# Patient Record
Sex: Male | Born: 1950
Health system: Southern US, Community
[De-identification: ages and names within clinical notes are randomized; demographics above are authoritative.]

## PROBLEM LIST (undated history)

## (undated) DIAGNOSIS — K589 Irritable bowel syndrome without diarrhea: Secondary | ICD-10-CM

## (undated) DIAGNOSIS — E7800 Pure hypercholesterolemia, unspecified: Secondary | ICD-10-CM

## (undated) DIAGNOSIS — R569 Unspecified convulsions: Secondary | ICD-10-CM

## (undated) DIAGNOSIS — R202 Paresthesia of skin: Secondary | ICD-10-CM

## (undated) DIAGNOSIS — F32A Depression, unspecified: Secondary | ICD-10-CM

## (undated) DIAGNOSIS — F329 Major depressive disorder, single episode, unspecified: Secondary | ICD-10-CM

## (undated) DIAGNOSIS — B20 Human immunodeficiency virus [HIV] disease: Secondary | ICD-10-CM

## (undated) DIAGNOSIS — Z21 Asymptomatic human immunodeficiency virus [HIV] infection status: Secondary | ICD-10-CM

## (undated) HISTORY — PX: ROTATOR CUFF REPAIR: SHX139

## (undated) HISTORY — PX: KNEE SURGERY: SHX244

## (undated) HISTORY — DX: Paresthesia of skin: R20.2

## (undated) HISTORY — PX: JOINT REPLACEMENT: SHX530

---

## 2009-04-16 ENCOUNTER — Ambulatory Visit: Payer: Self-pay | Admitting: Diagnostic Radiology

## 2009-04-16 ENCOUNTER — Emergency Department (HOSPITAL_BASED_OUTPATIENT_CLINIC_OR_DEPARTMENT_OTHER): Admission: EM | Admit: 2009-04-16 | Discharge: 2009-04-16 | Payer: Self-pay | Admitting: Emergency Medicine

## 2009-11-05 ENCOUNTER — Ambulatory Visit: Payer: Self-pay | Admitting: Diagnostic Radiology

## 2009-11-05 ENCOUNTER — Emergency Department (HOSPITAL_BASED_OUTPATIENT_CLINIC_OR_DEPARTMENT_OTHER): Admission: EM | Admit: 2009-11-05 | Discharge: 2009-11-05 | Payer: Self-pay | Admitting: Emergency Medicine

## 2010-08-10 ENCOUNTER — Emergency Department (HOSPITAL_BASED_OUTPATIENT_CLINIC_OR_DEPARTMENT_OTHER)
Admission: EM | Admit: 2010-08-10 | Discharge: 2010-08-10 | Payer: Self-pay | Source: Home / Self Care | Admitting: Emergency Medicine

## 2010-08-26 ENCOUNTER — Emergency Department (HOSPITAL_BASED_OUTPATIENT_CLINIC_OR_DEPARTMENT_OTHER)
Admission: EM | Admit: 2010-08-26 | Discharge: 2010-08-26 | Payer: Self-pay | Source: Home / Self Care | Admitting: Emergency Medicine

## 2010-11-03 LAB — BASIC METABOLIC PANEL
Calcium: 9.8 mg/dL (ref 8.4–10.5)
GFR calc Af Amer: 54 mL/min — ABNORMAL LOW (ref >60)
GFR calc non Af Amer: 45 mL/min — ABNORMAL LOW (ref >60)
Glucose, Bld: 104 mg/dL — ABNORMAL HIGH (ref 70–99)
Sodium: 141 mEq/L (ref 135–145)

## 2010-11-29 LAB — BASIC METABOLIC PANEL
BUN: 10 mg/dL (ref 6–23)
Glucose, Bld: 103 mg/dL — ABNORMAL HIGH (ref 70–99)
Potassium: 4.4 mEq/L (ref 3.5–5.1)
Sodium: 137 mEq/L (ref 135–145)

## 2010-11-29 LAB — CBC
HCT: 45.5 % (ref 39.0–52.0)
MCHC: 34 g/dL (ref 30.0–36.0)
RBC: 4.09 MIL/uL — ABNORMAL LOW (ref 4.22–5.81)
RDW: 12.9 % (ref 11.5–15.5)
WBC: 13.8 10*3/uL — ABNORMAL HIGH (ref 4.0–10.5)

## 2010-11-29 LAB — DIFFERENTIAL
Basophils Absolute: 0.3 10*3/uL — ABNORMAL HIGH (ref 0.0–0.1)
Basophils Relative: 2 % — ABNORMAL HIGH (ref 0–1)
Lymphocytes Relative: 13 % (ref 12–46)
Monocytes Absolute: 0.8 10*3/uL (ref 0.1–1.0)

## 2011-06-25 ENCOUNTER — Emergency Department (HOSPITAL_BASED_OUTPATIENT_CLINIC_OR_DEPARTMENT_OTHER)
Admission: EM | Admit: 2011-06-25 | Discharge: 2011-06-25 | Disposition: A | Discharge status: Home / Self Care | Payer: Medicare Other | Attending: Emergency Medicine | Admitting: Emergency Medicine

## 2011-06-25 ENCOUNTER — Encounter: Payer: Self-pay | Admitting: *Deleted

## 2011-06-25 DIAGNOSIS — Z21 Asymptomatic human immunodeficiency virus [HIV] infection status: Secondary | ICD-10-CM | POA: Insufficient documentation

## 2011-06-25 DIAGNOSIS — K589 Irritable bowel syndrome without diarrhea: Secondary | ICD-10-CM | POA: Insufficient documentation

## 2011-06-25 DIAGNOSIS — M25519 Pain in unspecified shoulder: Secondary | ICD-10-CM | POA: Insufficient documentation

## 2011-06-25 DIAGNOSIS — M25511 Pain in right shoulder: Secondary | ICD-10-CM

## 2011-06-25 DIAGNOSIS — M79609 Pain in unspecified limb: Secondary | ICD-10-CM | POA: Insufficient documentation

## 2011-06-25 HISTORY — DX: Irritable bowel syndrome, unspecified: K58.9

## 2011-06-25 HISTORY — DX: Unspecified convulsions: R56.9

## 2011-06-25 HISTORY — DX: Major depressive disorder, single episode, unspecified: F32.9

## 2011-06-25 HISTORY — DX: Depression, unspecified: F32.A

## 2011-06-25 HISTORY — DX: Human immunodeficiency virus (HIV) disease: B20

## 2011-06-25 HISTORY — DX: Asymptomatic human immunodeficiency virus (hiv) infection status: Z21

## 2011-06-25 MED ORDER — HYDROCODONE-ACETAMINOPHEN 5-500 MG PO TABS: 2.0000 | ORAL_TABLET | ORAL | Status: AC | PRN

## 2011-06-25 MED ORDER — ONDANSETRON HCL 4 MG PO TABS: 4.0000 mg | ORAL_TABLET | Freq: Four times a day (QID) | ORAL | Status: AC

## 2011-06-25 MED ORDER — HYDROMORPHONE HCL 1 MG/ML IJ SOLN
1.0000 mg | Freq: Once | INTRAMUSCULAR | Status: AC
  Administered 2011-06-25: 1 mg via INTRAMUSCULAR
  Filled 2011-06-25: qty 1

## 2011-06-25 NOTE — ED Notes (Signed)
MD at bedside. 

## 2011-06-25 NOTE — ED Notes (Signed)
C/o shoulder pain s/p rotator cuff surgery. PO pain meds not helping.

## 2011-06-25 NOTE — ED Notes (Signed)
Pt had rotator cuff repair surgery on 10/31, c/o extreme pain and no relief from pain meds.

## 2011-06-25 NOTE — ED Provider Notes (Signed)
History     CSN: [id number] Arrival date & time: 06/25/2011  5:37 AM   First MD Initiated Contact with Patient 06/25/11 [phone]      Chief Complaint  Patient presents with  . Arm Pain    (Consider location/radiation/quality/duration/timing/severity/associated sxs/prior treatment) Patient is a 60 y.o. male presenting with arm pain.  Arm Pain This is a new problem. The current episode started 6 to 12 hours ago. The problem occurs constantly. The problem has been gradually worsening. Pertinent negatives include no chest pain, no abdominal pain, no headaches and no shortness of breath. Exacerbated by: Any kind of movement or palpation. The symptoms are relieved by nothing. He has tried rest and a cold compress for the symptoms. The treatment provided no relief.   Patient had orthopedic surgeon his right shoulder yesterday afternoon. He was discharged home and states last night that the anesthetic wore off, and he took his prescribed oxycodone and muscle relaxers, and despite all this he is still having severe pain. He is unable to sleep. He called his physician who recommended doubling up on his oxycodone, but only take half a tab and presented here. States he knows how to manage pain because he has had 2 hip surgeries and feels like the pain is too severe to handle at home. No vomiting. No fevers. No numbness or tingling in his arm or hand. Symptoms are severe. Pain is nonradiating. Pain described as sharp in nature. No history of same  Past Medical History  Diagnosis Date  . IBS (irritable bowel syndrome)   . Depression   . HIV (human immunodeficiency virus infection)   . Seizures     Past Surgical History  Procedure Date  . Joint replacement   . Rotator cuff repair     History reviewed. No pertinent family history.  History  Substance Use Topics  . Smoking status: Never Smoker   . Smokeless tobacco: Not on file  . Alcohol Use: Yes      Review of Systems  Constitutional:  Negative for fever and chills.  HENT: Negative for neck pain and neck stiffness.   Eyes: Negative for pain.  Respiratory: Negative for shortness of breath.   Cardiovascular: Negative for chest pain.  Gastrointestinal: Negative for abdominal pain.  Genitourinary: Negative for dysuria.  Musculoskeletal: Negative for back pain and gait problem.  Skin: Positive for wound. Negative for rash.  Neurological: Negative for headaches.  All other systems reviewed and are negative.    Allergies  Review of patient's allergies indicates no known allergies.  Home Medications   Current Outpatient Rx  Name Route Sig Dispense Refill  . BUPROPION HCL ER (SR) 150 MG PO TB12 Oral Take 150 mg by mouth 2 (two) times daily.      Marland Kitchen DIPHENOXYLATE-ATROPINE 2.5-0.025 MG PO TABS Oral Take 1 tablet by mouth 4 (four) times daily as needed.      Marland Kitchen ESCITALOPRAM OXALATE 10 MG PO TABS Oral Take 10 mg by mouth daily.      Marland Kitchen LAMIVUDINE 300 MG PO TABS Oral Take 300 mg by mouth daily.      Marland Kitchen LOPINAVIR-RITONAVIR 200-50 MG PO TABS Oral Take 2 tablets by mouth 2 (two) times daily.      Marland Kitchen METHOCARBAMOL 500 MG PO TABS Oral Take 500 mg by mouth 4 (four) times daily.      Marland Kitchen PHENYTOIN SODIUM EXTENDED 100 MG PO CAPS Oral Take by mouth 3 (three) times daily.      Marland Kitchen ROSUVASTATIN  CALCIUM 10 MG PO TABS Oral Take 10 mg by mouth daily.        BP 134/80  Pulse 69  Temp(Src) 98.3 F (36.8 C) (Oral)  Resp 18  SpO2 97%  Physical Exam  Constitutional: He is oriented to person, place, and time. He appears well-developed and well-nourished.  HENT:  Head: Normocephalic and atraumatic.  Eyes: Conjunctivae and EOM are normal. Pupils are equal, round, and reactive to light.  Neck: Full passive range of motion without pain. Neck supple. No thyromegaly present.       No midline cervical tenderness  Cardiovascular: Normal rate, regular rhythm, S1 normal, S2 normal and intact distal pulses.   Pulmonary/Chest: Effort normal and breath  sounds normal.  Abdominal: Soft. Bowel sounds are normal. There is no tenderness. There is no CVA tenderness.  Musculoskeletal: Normal range of motion.       Right upper extremity: Multiple padded bandages in place over right shoulder with a sling in place. Tender to palpation over the shoulder, but not over the trapezius or the bicep. Distal neurovascular is intact. Do to surgery less than 24 hours ago I did not remove the bandages.  Neurological: He is alert and oriented to person, place, and time. He has normal strength and normal reflexes. No cranial nerve deficit or sensory deficit. He displays a negative Romberg sign. GCS eye subscore is 4. GCS verbal subscore is 5. GCS motor subscore is 6.  Skin: Skin is warm and dry. No rash noted. No cyanosis. Nails show no clubbing.  Psychiatric: He has a normal mood and affect. His speech is normal and behavior is normal.    ED Course  Procedures (including critical care time)  Plan Dilaudid for pain control.   MDM  Patient in severe pain status post right shoulder surgery for rotator cuff repair, less than 24 hours ago. Dilaudid given as above, ice applied, and I strongly encouraged patient to take medications as prescribed. Hydrocodone prescription provided in view of oxycodone as that is making patient very nauseated. I strongly encouraged to keep ice on his shoulder as directed by his physician.            Sunnie Nielsen, MD 06/25/11 [phone]

## 2012-05-20 ENCOUNTER — Encounter (HOSPITAL_BASED_OUTPATIENT_CLINIC_OR_DEPARTMENT_OTHER): Payer: Self-pay | Admitting: *Deleted

## 2012-05-20 ENCOUNTER — Emergency Department (HOSPITAL_BASED_OUTPATIENT_CLINIC_OR_DEPARTMENT_OTHER)
Admission: EM | Admit: 2012-05-20 | Discharge: 2012-05-20 | Disposition: A | Discharge status: Home / Self Care | Payer: Medicare Other | Attending: Emergency Medicine | Admitting: Emergency Medicine

## 2012-05-20 DIAGNOSIS — Z21 Asymptomatic human immunodeficiency virus [HIV] infection status: Secondary | ICD-10-CM | POA: Insufficient documentation

## 2012-05-20 DIAGNOSIS — N419 Inflammatory disease of prostate, unspecified: Secondary | ICD-10-CM

## 2012-05-20 DIAGNOSIS — K589 Irritable bowel syndrome without diarrhea: Secondary | ICD-10-CM | POA: Insufficient documentation

## 2012-05-20 DIAGNOSIS — R339 Retention of urine, unspecified: Secondary | ICD-10-CM | POA: Insufficient documentation

## 2012-05-20 LAB — URINALYSIS, ROUTINE W REFLEX MICROSCOPIC
Bilirubin Urine: NEGATIVE
Leukocytes, UA: NEGATIVE
Specific Gravity, Urine: 1.012 (ref 1.005–1.030)

## 2012-05-20 LAB — URINE MICROSCOPIC-ADD ON

## 2012-05-20 MED ORDER — CIPROFLOXACIN HCL 500 MG PO TABS: 500.0000 mg | ORAL_TABLET | Freq: Two times a day (BID) | ORAL | Status: DC

## 2012-05-20 MED ORDER — CIPROFLOXACIN HCL 500 MG PO TABS
500.0000 mg | ORAL_TABLET | Freq: Once | ORAL | Status: AC
  Administered 2012-05-20: 500 mg via ORAL
  Filled 2012-05-20: qty 1

## 2012-05-20 NOTE — ED Notes (Signed)
Unable to void>frequency>does not empty bladder

## 2012-05-20 NOTE — ED Provider Notes (Signed)
History     CSN: [id number]  Arrival date & time 05/20/12  1605   First MD Initiated Contact with Patient 05/20/12 1624      Chief Complaint  Patient presents with  . Urinary Retention    (Consider location/radiation/quality/duration/timing/severity/associated sxs/prior treatment) HPI Pt reports 2 days of urinary frequency, feeling of incomplete voiding and bladder pressure. Denies any pain with urination. No fever, no pain with bowel movements. He states symptoms come and go, no particular provoking or relieving factors. History of HIV but compliant with meds and latest CD4 is adequate and HIV viral load is undetectable per patient. Older brother has history of prostate cancer.   Past Medical History  Diagnosis Date  . IBS (irritable bowel syndrome)   . Depression   . HIV (human immunodeficiency virus infection)   . Seizures     Past Surgical History  Procedure Date  . Joint replacement   . Rotator cuff repair     History reviewed. No pertinent family history.  History  Substance Use Topics  . Smoking status: Never Smoker   . Smokeless tobacco: Not on file  . Alcohol Use: Yes      Review of Systems All other systems reviewed and are negative except as noted in HPI.   Allergies  Review of patient's allergies indicates no known allergies.  Home Medications   Current Outpatient Rx  Name Route Sig Dispense Refill  . BUPROPION HCL ER (SR) 150 MG PO TB12 Oral Take 150 mg by mouth 2 (two) times daily.      Marland Kitchen DIPHENOXYLATE-ATROPINE 2.5-0.025 MG PO TABS Oral Take 1 tablet by mouth 4 (four) times daily as needed.      Marland Kitchen ESCITALOPRAM OXALATE 10 MG PO TABS Oral Take 10 mg by mouth daily.      Marland Kitchen LAMIVUDINE 300 MG PO TABS Oral Take 300 mg by mouth daily.      Marland Kitchen LOPINAVIR-RITONAVIR 200-50 MG PO TABS Oral Take 2 tablets by mouth 2 (two) times daily.      Marland Kitchen METHOCARBAMOL 500 MG PO TABS Oral Take 500 mg by mouth 4 (four) times daily.      Marland Kitchen PHENYTOIN SODIUM EXTENDED 100 MG  PO CAPS Oral Take by mouth 3 (three) times daily.      Marland Kitchen ROSUVASTATIN CALCIUM 10 MG PO TABS Oral Take 10 mg by mouth daily.        BP 126/75  Pulse 98  Temp 98.5 F (36.9 C) (Oral)  Resp 20  Ht 5\' 8"  (1.727 m)  Wt 160 lb (72.576 kg)  BMI 24.33 kg/m2  SpO2 95%  Physical Exam  Nursing note and vitals reviewed. Constitutional: He is oriented to person, place, and time. He appears well-developed and well-nourished.  HENT:  Head: Normocephalic and atraumatic.  Eyes: EOM are normal. Pupils are equal, round, and reactive to light.  Neck: Normal range of motion. Neck supple.  Cardiovascular: Normal rate, normal heart sounds and intact distal pulses.   Pulmonary/Chest: Effort normal and breath sounds normal.  Abdominal: Bowel sounds are normal. He exhibits no distension. There is no tenderness.  Genitourinary:       Prostate normal size, mildly tender, not boggy, no rectal masses, stool color is normal  Musculoskeletal: Normal range of motion. He exhibits no edema and no tenderness.  Neurological: He is alert and oriented to person, place, and time. He has normal strength. No cranial nerve deficit or sensory deficit.  Skin: Skin is warm and dry. No rash  noted.  Psychiatric: He has a normal mood and affect.    ED Course  Procedures (including critical care time)  Labs Reviewed  URINALYSIS, ROUTINE W REFLEX MICROSCOPIC - Abnormal; Notable for the following:    Hgb urine dipstick MODERATE (*)     Protein, ur 100 (*)     All other components within normal limits  URINE MICROSCOPIC-ADD ON  URINE CULTURE   No results found.   No diagnosis found.    MDM  UA unremarkable. He is on some medications which may cause urinary retention, but he is able to void so do not feel foley is indicated. Will treat for possible prostatitis as well, and advised PCP follow up in [place] next week for a recheck.         Bilaal Leib B. Bernette Mayers, MD 05/20/12 1714

## 2012-05-20 NOTE — ED Notes (Signed)
MD at bedside. 

## 2012-05-21 LAB — URINE CULTURE

## 2012-06-20 ENCOUNTER — Other Ambulatory Visit (HOSPITAL_COMMUNITY): Payer: Self-pay | Admitting: *Deleted

## 2012-06-20 ENCOUNTER — Other Ambulatory Visit: Payer: Self-pay | Admitting: *Deleted

## 2012-06-20 DIAGNOSIS — N62 Hypertrophy of breast: Secondary | ICD-10-CM

## 2012-06-28 ENCOUNTER — Ambulatory Visit
Admission: RE | Admit: 2012-06-28 | Discharge: 2012-06-28 | Disposition: A | Discharge status: Home / Self Care | Payer: Medicare Other | Source: Ambulatory Visit | Attending: Internal Medicine | Admitting: Internal Medicine

## 2012-06-28 DIAGNOSIS — N62 Hypertrophy of breast: Secondary | ICD-10-CM

## 2012-11-27 ENCOUNTER — Emergency Department (HOSPITAL_BASED_OUTPATIENT_CLINIC_OR_DEPARTMENT_OTHER): Payer: Medicare Other

## 2012-11-27 ENCOUNTER — Emergency Department (HOSPITAL_BASED_OUTPATIENT_CLINIC_OR_DEPARTMENT_OTHER)
Admission: EM | Admit: 2012-11-27 | Discharge: 2012-11-27 | Disposition: A | Discharge status: Home / Self Care | Payer: Medicare Other | Attending: Emergency Medicine | Admitting: Emergency Medicine

## 2012-11-27 ENCOUNTER — Encounter (HOSPITAL_BASED_OUTPATIENT_CLINIC_OR_DEPARTMENT_OTHER): Payer: Self-pay | Admitting: *Deleted

## 2012-11-27 DIAGNOSIS — K589 Irritable bowel syndrome without diarrhea: Secondary | ICD-10-CM | POA: Insufficient documentation

## 2012-11-27 DIAGNOSIS — J069 Acute upper respiratory infection, unspecified: Secondary | ICD-10-CM

## 2012-11-27 DIAGNOSIS — J329 Chronic sinusitis, unspecified: Secondary | ICD-10-CM

## 2012-11-27 DIAGNOSIS — F329 Major depressive disorder, single episode, unspecified: Secondary | ICD-10-CM | POA: Insufficient documentation

## 2012-11-27 DIAGNOSIS — D696 Thrombocytopenia, unspecified: Secondary | ICD-10-CM

## 2012-11-27 DIAGNOSIS — R059 Cough, unspecified: Secondary | ICD-10-CM | POA: Insufficient documentation

## 2012-11-27 DIAGNOSIS — R05 Cough: Secondary | ICD-10-CM | POA: Insufficient documentation

## 2012-11-27 DIAGNOSIS — Z21 Asymptomatic human immunodeficiency virus [HIV] infection status: Secondary | ICD-10-CM | POA: Insufficient documentation

## 2012-11-27 DIAGNOSIS — F3289 Other specified depressive episodes: Secondary | ICD-10-CM | POA: Insufficient documentation

## 2012-11-27 DIAGNOSIS — Z8669 Personal history of other diseases of the nervous system and sense organs: Secondary | ICD-10-CM | POA: Insufficient documentation

## 2012-11-27 DIAGNOSIS — Z79899 Other long term (current) drug therapy: Secondary | ICD-10-CM | POA: Insufficient documentation

## 2012-11-27 LAB — CBC WITH DIFFERENTIAL/PLATELET
HCT: 48.3 % (ref 39.0–52.0)
Lymphocytes Relative: 25 % (ref 12–46)
MCHC: 34.2 g/dL (ref 30.0–36.0)
Neutro Abs: 3.1 10*3/uL (ref 1.7–7.7)
Neutrophils Relative %: 58 % (ref 43–77)
Platelets: 96 10*3/uL — ABNORMAL LOW (ref 150–400)
RBC: 4.54 MIL/uL (ref 4.22–5.81)
RDW: 12.7 % (ref 11.5–15.5)
WBC: 5.3 10*3/uL (ref 4.0–10.5)

## 2012-11-27 MED ORDER — AZITHROMYCIN 250 MG PO TABS: 250.0000 mg | ORAL_TABLET | Freq: Every day | ORAL | Status: DC

## 2012-11-27 NOTE — ED Provider Notes (Signed)
History     CSN: [id number]  Arrival date & time 11/27/12  1339   First MD Initiated Contact with Patient 11/27/12 1400      Chief Complaint  Patient presents with  . Fever    (Consider location/radiation/quality/duration/timing/severity/associated sxs/prior treatment) HPI Comments: Patient with a history of HIV presents today with a chief complaint of cough, which is productive of clear sputum that has been present since yesterday.  He has also had some nasal congestion and rhinorrhea.  He denies chest pain or SOB.  He reports that he has felt warm, but has not actually taken his temperature.  He has not taken anything for symptoms prior to arrival.  Temperature 99 orally upon arrival.  He states that he is compliant with his HIV medications.  He reports that his last CD4 count was 400 and that his viral load was undetectable.  No history of AIDs.    The history is provided by the patient.    Past Medical History  Diagnosis Date  . IBS (irritable bowel syndrome)   . Depression   . HIV (human immunodeficiency virus infection)   . Seizures     Past Surgical History  Procedure Laterality Date  . Joint replacement    . Rotator cuff repair      History reviewed. No pertinent family history.  History  Substance Use Topics  . Smoking status: Never Smoker   . Smokeless tobacco: Not on file  . Alcohol Use: Yes      Review of Systems  Constitutional: Positive for fever and chills.  HENT: Positive for sinus pressure. Negative for congestion, sore throat, rhinorrhea and trouble swallowing.   Respiratory: Positive for cough. Negative for shortness of breath and wheezing.   Cardiovascular: Negative for chest pain.  Skin: Negative for rash.  All other systems reviewed and are negative.    Allergies  Review of patient's allergies indicates no known allergies.  Home Medications   Current Outpatient Rx  Name  Route  Sig  Dispense  Refill  . loperamide (IMODIUM A-D) 2 MG  tablet   Oral   Take 2 mg by mouth 4 (four) times daily as needed for diarrhea or loose stools.         . pravastatin (PRAVACHOL) 20 MG tablet   Oral   Take 20 mg by mouth daily.         . saquinavir (INVIRASE) 500 MG tablet   Oral   Take 1,000 mg by mouth 2 (two) times daily.         Marland Kitchen buPROPion (WELLBUTRIN SR) 150 MG 12 hr tablet   Oral   Take 150 mg by mouth 2 (two) times daily.           . ciprofloxacin (CIPRO) 500 MG tablet   Oral   Take 1 tablet (500 mg total) by mouth 2 (two) times daily.   28 tablet   0   . diphenoxylate-atropine (LOMOTIL) 2.5-0.025 MG per tablet   Oral   Take 1 tablet by mouth 4 (four) times daily as needed.           Marland Kitchen escitalopram (LEXAPRO) 10 MG tablet   Oral   Take 10 mg by mouth daily.           Marland Kitchen lamivudine (EPIVIR) 300 MG tablet   Oral   Take 300 mg by mouth daily.           Marland Kitchen lopinavir-ritonavir (KALETRA) 200-50 MG per tablet  Oral   Take 2 tablets by mouth 2 (two) times daily.           . methocarbamol (ROBAXIN) 500 MG tablet   Oral   Take 500 mg by mouth 4 (four) times daily.           . phenytoin (DILANTIN) 100 MG ER capsule   Oral   Take by mouth 3 (three) times daily.           . rosuvastatin (CRESTOR) 10 MG tablet   Oral   Take 10 mg by mouth daily.             BP 107/74  Pulse 87  Temp(Src) 99 F (37.2 C) (Oral)  Resp 15  Ht 5\' 7"  (1.702 m)  Wt 160 lb (72.576 kg)  BMI 25.05 kg/m2  SpO2 98%  Physical Exam  Nursing note and vitals reviewed. Constitutional: He appears well-developed and well-nourished. No distress.  HENT:  Head: Normocephalic and atraumatic.  Right Ear: Hearing, tympanic membrane and ear canal normal.  Left Ear: Hearing, tympanic membrane and ear canal normal.  Nose: Nose normal. Right sinus exhibits no maxillary sinus tenderness and no frontal sinus tenderness. Left sinus exhibits no maxillary sinus tenderness and no frontal sinus tenderness.  Mouth/Throat: Uvula is  midline, oropharynx is clear and moist and mucous membranes are normal.  Neck: Normal range of motion. Neck supple.  Cardiovascular: Normal rate, regular rhythm and normal heart sounds.   Pulmonary/Chest: Effort normal and breath sounds normal. No respiratory distress. He has no wheezes. He has no rales.  Neurological: He is alert.  Skin: Skin is warm and dry. He is not diaphoretic.  Psychiatric: He has a normal mood and affect.    ED Course  Procedures (including critical care time)  Labs Reviewed - No data to display No results found.   No diagnosis found.    MDM  Patient with a history of HIV presents with a chief complaint of cough and nasal congestion.  CXR negative.  Patient is afebrile.  No signs of respiratory distress.  Labs unremarkable.  Symptoms most consistent with Viral URI.  Return precautions given to the patient.        Pascal Lux [place], PA-C 11/30/12 1220

## 2012-11-27 NOTE — ED Notes (Signed)
Fever , chills prod cough with clr sputum onset yest.  Denies other s/s.

## 2012-11-27 NOTE — ED Notes (Signed)
Patient transported to X-ray 

## 2012-12-02 NOTE — ED Provider Notes (Signed)
Medical screening examination/treatment/procedure(s) were performed by non-physician practitioner and as supervising physician I was immediately available for consultation/collaboration.   Orlondo Holycross W. Lizbeth Feijoo, MD 12/02/12 1114 

## 2013-04-30 ENCOUNTER — Emergency Department (HOSPITAL_BASED_OUTPATIENT_CLINIC_OR_DEPARTMENT_OTHER): Payer: Medicare Other

## 2013-04-30 ENCOUNTER — Encounter (HOSPITAL_BASED_OUTPATIENT_CLINIC_OR_DEPARTMENT_OTHER): Payer: Self-pay | Admitting: Emergency Medicine

## 2013-04-30 ENCOUNTER — Emergency Department (HOSPITAL_BASED_OUTPATIENT_CLINIC_OR_DEPARTMENT_OTHER)
Admission: EM | Admit: 2013-04-30 | Discharge: 2013-04-30 | Disposition: A | Discharge status: Home / Self Care | Payer: Medicare Other | Attending: Emergency Medicine | Admitting: Emergency Medicine

## 2013-04-30 DIAGNOSIS — Z792 Long term (current) use of antibiotics: Secondary | ICD-10-CM | POA: Insufficient documentation

## 2013-04-30 DIAGNOSIS — F3289 Other specified depressive episodes: Secondary | ICD-10-CM | POA: Insufficient documentation

## 2013-04-30 DIAGNOSIS — X58XXXA Exposure to other specified factors, initial encounter: Secondary | ICD-10-CM | POA: Insufficient documentation

## 2013-04-30 DIAGNOSIS — Z8719 Personal history of other diseases of the digestive system: Secondary | ICD-10-CM | POA: Insufficient documentation

## 2013-04-30 DIAGNOSIS — S92301A Fracture of unspecified metatarsal bone(s), right foot, initial encounter for closed fracture: Secondary | ICD-10-CM

## 2013-04-30 DIAGNOSIS — S92309A Fracture of unspecified metatarsal bone(s), unspecified foot, initial encounter for closed fracture: Secondary | ICD-10-CM | POA: Insufficient documentation

## 2013-04-30 DIAGNOSIS — G40909 Epilepsy, unspecified, not intractable, without status epilepticus: Secondary | ICD-10-CM | POA: Insufficient documentation

## 2013-04-30 DIAGNOSIS — F329 Major depressive disorder, single episode, unspecified: Secondary | ICD-10-CM | POA: Insufficient documentation

## 2013-04-30 DIAGNOSIS — Z79899 Other long term (current) drug therapy: Secondary | ICD-10-CM | POA: Insufficient documentation

## 2013-04-30 DIAGNOSIS — Z21 Asymptomatic human immunodeficiency virus [HIV] infection status: Secondary | ICD-10-CM | POA: Insufficient documentation

## 2013-04-30 DIAGNOSIS — Y929 Unspecified place or not applicable: Secondary | ICD-10-CM | POA: Insufficient documentation

## 2013-04-30 DIAGNOSIS — Y939 Activity, unspecified: Secondary | ICD-10-CM | POA: Insufficient documentation

## 2013-04-30 MED ORDER — IBUPROFEN 600 MG PO TABS: 600.0000 mg | ORAL_TABLET | Freq: Four times a day (QID) | ORAL | Status: DC | PRN

## 2013-04-30 MED ORDER — HYDROCODONE-ACETAMINOPHEN 5-325 MG PO TABS: 1.0000 | ORAL_TABLET | Freq: Four times a day (QID) | ORAL | Status: DC | PRN
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
## 2013-04-30 NOTE — ED Notes (Signed)
Foot started hurting 2 days ago, hurting top of right foot with swelling, denies injuries

## 2013-05-01 NOTE — ED Provider Notes (Signed)
Medical screening examination/treatment/procedure(s) were performed by non-physician practitioner and as supervising physician I was immediately available for consultation/collaboration.  Doug Sou, MD 05/01/13 0010

## 2013-05-23 ENCOUNTER — Emergency Department (HOSPITAL_BASED_OUTPATIENT_CLINIC_OR_DEPARTMENT_OTHER)
Admission: EM | Admit: 2013-05-23 | Discharge: 2013-05-23 | Disposition: A | Discharge status: Other Acute Inpatient Hospital | Payer: Medicare Other | Attending: Emergency Medicine | Admitting: Emergency Medicine

## 2013-05-23 ENCOUNTER — Encounter (HOSPITAL_BASED_OUTPATIENT_CLINIC_OR_DEPARTMENT_OTHER): Payer: Self-pay | Admitting: *Deleted

## 2013-05-23 DIAGNOSIS — E860 Dehydration: Secondary | ICD-10-CM | POA: Insufficient documentation

## 2013-05-23 DIAGNOSIS — F329 Major depressive disorder, single episode, unspecified: Secondary | ICD-10-CM | POA: Insufficient documentation

## 2013-05-23 DIAGNOSIS — Z79899 Other long term (current) drug therapy: Secondary | ICD-10-CM | POA: Insufficient documentation

## 2013-05-23 DIAGNOSIS — R197 Diarrhea, unspecified: Secondary | ICD-10-CM

## 2013-05-23 DIAGNOSIS — F3289 Other specified depressive episodes: Secondary | ICD-10-CM | POA: Insufficient documentation

## 2013-05-23 DIAGNOSIS — Z21 Asymptomatic human immunodeficiency virus [HIV] infection status: Secondary | ICD-10-CM | POA: Insufficient documentation

## 2013-05-23 DIAGNOSIS — N179 Acute kidney failure, unspecified: Secondary | ICD-10-CM

## 2013-05-23 DIAGNOSIS — R1110 Vomiting, unspecified: Secondary | ICD-10-CM | POA: Insufficient documentation

## 2013-05-23 DIAGNOSIS — Z792 Long term (current) use of antibiotics: Secondary | ICD-10-CM | POA: Insufficient documentation

## 2013-05-23 DIAGNOSIS — G40909 Epilepsy, unspecified, not intractable, without status epilepticus: Secondary | ICD-10-CM | POA: Insufficient documentation

## 2013-05-23 DIAGNOSIS — Z8719 Personal history of other diseases of the digestive system: Secondary | ICD-10-CM | POA: Insufficient documentation

## 2013-05-23 LAB — CBC WITH DIFFERENTIAL/PLATELET
Basophils Absolute: 0 10*3/uL (ref 0.0–0.1)
Eosinophils Relative: 0 % (ref 0–5)
HCT: 47 % (ref 39.0–52.0)
Hemoglobin: 16.7 g/dL (ref 13.0–17.0)
Lymphs Abs: 0.6 10*3/uL — ABNORMAL LOW (ref 0.7–4.0)
MCH: 32.7 pg (ref 26.0–34.0)
MCHC: 35.5 g/dL (ref 30.0–36.0)
MCV: 92.2 fL (ref 78.0–100.0)
Monocytes Absolute: 0.7 10*3/uL (ref 0.1–1.0)
Neutro Abs: 8.9 10*3/uL — ABNORMAL HIGH (ref 1.7–7.7)
Neutrophils Relative %: 84 % — ABNORMAL HIGH (ref 43–77)
Platelets: 271 10*3/uL (ref 150–400)
WBC: 10.2 10*3/uL (ref 4.0–10.5)

## 2013-05-23 LAB — URINALYSIS, ROUTINE W REFLEX MICROSCOPIC
Bilirubin Urine: NEGATIVE
Glucose, UA: NEGATIVE mg/dL
Ketones, ur: NEGATIVE mg/dL
pH: 5.5 (ref 5.0–8.0)

## 2013-05-23 LAB — COMPREHENSIVE METABOLIC PANEL
AST: 48 U/L — ABNORMAL HIGH (ref 0–37)
CO2: 15 mEq/L — ABNORMAL LOW (ref 19–32)
Calcium: 9.5 mg/dL (ref 8.4–10.5)
Creatinine, Ser: 7.8 mg/dL — ABNORMAL HIGH (ref 0.50–1.35)
GFR calc Af Amer: 8 mL/min — ABNORMAL LOW (ref >90)
GFR calc non Af Amer: 7 mL/min — ABNORMAL LOW (ref >90)
Glucose, Bld: 119 mg/dL — ABNORMAL HIGH (ref 70–99)
Sodium: 128 mEq/L — ABNORMAL LOW (ref 135–145)
Total Protein: 7.6 g/dL (ref 6.0–8.3)

## 2013-05-23 LAB — URINE MICROSCOPIC-ADD ON

## 2013-05-23 MED ORDER — SODIUM CHLORIDE 0.9 % IV SOLN
Freq: Once | INTRAVENOUS | Status: AC
  Administered 2013-05-23: 21:00:00 via INTRAVENOUS

## 2013-05-23 MED ORDER — MORPHINE SULFATE 2 MG/ML IJ SOLN
1.0000 mg | Freq: Once | INTRAMUSCULAR | Status: AC
  Administered 2013-05-23: 1 mg via INTRAVENOUS
  Filled 2013-05-23: qty 1

## 2013-05-23 MED ORDER — ONDANSETRON 8 MG PO TBDP
8.0000 mg | ORAL_TABLET | Freq: Once | ORAL | Status: AC
  Administered 2013-05-23: 8 mg via ORAL

## 2013-05-23 MED ORDER — ONDANSETRON 8 MG PO TBDP
ORAL_TABLET | ORAL | Status: AC
  Filled 2013-05-23: qty 1

## 2013-05-23 MED ORDER — SODIUM CHLORIDE 0.9 % IV SOLN
Freq: Once | INTRAVENOUS | Status: AC
  Administered 2013-05-23: 1000 mL via INTRAVENOUS

## 2013-05-23 NOTE — ED Provider Notes (Signed)
CSN: [id number]     Arrival date & time 05/23/13  1756 History   First MD Initiated Contact with Patient 05/23/13 1820     Chief Complaint  Patient presents with  . Diarrhea   (Consider location/radiation/quality/duration/timing/severity/associated sxs/prior Treatment) Patient is a 62 y.o. male presenting with vomiting. The history is provided by the patient. No language interpreter was used.  Emesis Severity:  Moderate Duration:  4 days Timing:  Constant Number of daily episodes:  Multiple Able to tolerate:  Liquids Progression:  Worsening Chronicity:  New Recent urination:  Normal Relieved by:  None tried Worsened by:  Nothing tried Associated symptoms: diarrhea   Associated symptoms: no abdominal pain   Risk factors: no sick contacts   Pt reports diarrhea for the past 4 days.  Pt reports vomiting today.    Past Medical History  Diagnosis Date  . IBS (irritable bowel syndrome)   . Depression   . HIV (human immunodeficiency virus infection)   . Seizures    Past Surgical History  Procedure Laterality Date  . Joint replacement    . Rotator cuff repair     No family history on file. History  Substance Use Topics  . Smoking status: Never Smoker   . Smokeless tobacco: Not on file  . Alcohol Use: Yes    Review of Systems  Gastrointestinal: Positive for vomiting and diarrhea. Negative for abdominal pain.  All other systems reviewed and are negative.    Allergies  Review of patient's allergies indicates no known allergies.  Home Medications   Current Outpatient Rx  Name  Route  Sig  Dispense  Refill  . azithromycin (ZITHROMAX) 250 MG tablet   Oral   Take 1 tablet (250 mg total) by mouth daily. Take first 2 tablets together, then 1 every day until finished.   6 tablet   0   . buPROPion (WELLBUTRIN SR) 150 MG 12 hr tablet   Oral   Take 150 mg by mouth 2 (two) times daily.           . ciprofloxacin (CIPRO) 500 MG tablet   Oral   Take 1 tablet (500 mg  total) by mouth 2 (two) times daily.   28 tablet   0   . diphenoxylate-atropine (LOMOTIL) 2.5-0.025 MG per tablet   Oral   Take 1 tablet by mouth 4 (four) times daily as needed.           Marland Kitchen escitalopram (LEXAPRO) 10 MG tablet   Oral   Take 10 mg by mouth daily.           Marland Kitchen HYDROcodone-acetaminophen (NORCO/VICODIN) 5-325 MG per tablet   Oral   Take 1 tablet by mouth every 6 (six) hours as needed for pain.   7 tablet   0   . ibuprofen (ADVIL,MOTRIN) 600 MG tablet   Oral   Take 1 tablet (600 mg total) by mouth every 6 (six) hours as needed for pain.   30 tablet   0   . lamivudine (EPIVIR) 300 MG tablet   Oral   Take 300 mg by mouth daily.           Marland Kitchen loperamide (IMODIUM A-D) 2 MG tablet   Oral   Take 2 mg by mouth 4 (four) times daily as needed for diarrhea or loose stools.         Marland Kitchen lopinavir-ritonavir (KALETRA) 200-50 MG per tablet   Oral   Take 2 tablets by mouth 2 (two) times  daily.           . methocarbamol (ROBAXIN) 500 MG tablet   Oral   Take 500 mg by mouth 4 (four) times daily.           . phenytoin (DILANTIN) 100 MG ER capsule   Oral   Take by mouth 3 (three) times daily.           . pravastatin (PRAVACHOL) 20 MG tablet   Oral   Take 20 mg by mouth daily.         . rosuvastatin (CRESTOR) 10 MG tablet   Oral   Take 10 mg by mouth daily.           . saquinavir (INVIRASE) 500 MG tablet   Oral   Take 1,000 mg by mouth 2 (two) times daily.          BP 134/77  Pulse 87  Temp(Src) 98 F (36.7 C) (Oral)  Resp 20  Wt 150 lb (68.04 kg)  BMI 23.49 kg/m2  SpO2 97% Physical Exam  Nursing note and vitals reviewed. Constitutional: He is oriented to person, place, and time. He appears well-developed and well-nourished.  HENT:  Head: Normocephalic and atraumatic.  Right Ear: External ear normal.  Left Ear: External ear normal.  Mouth/Throat: Oropharynx is clear and moist.  Eyes: Pupils are equal, round, and reactive to light.  Neck:  Normal range of motion.  Cardiovascular: Normal rate and normal heart sounds.   Pulmonary/Chest: Effort normal and breath sounds normal.  Abdominal: Soft. Bowel sounds are normal.  Musculoskeletal: Normal range of motion.  Neurological: He is alert and oriented to person, place, and time. He has normal reflexes.  Skin: Skin is warm.  Psychiatric: He has a normal mood and affect.    ED Course  Procedures (including critical care time) Labs Review Labs Reviewed  URINALYSIS, ROUTINE W REFLEX MICROSCOPIC - Abnormal; Notable for the following:    APPearance CLOUDY (*)    Hgb urine dipstick LARGE (*)    Protein, ur 100 (*)    All other components within normal limits  URINE MICROSCOPIC-ADD ON - Abnormal; Notable for the following:    Squamous Epithelial / LPF FEW (*)    All other components within normal limits  CBC WITH DIFFERENTIAL  COMPREHENSIVE METABOLIC PANEL   Imaging Review No results found.  MDM   1. Renal failure, acute   2. Dehydration   3. Diarrhea    Pt given Iv fluids x 2 liters,   zofran for vomiting.    Pt labs show BUN of 72  and creat of 7.8.  K is 3.5.  Pt reports he prefers to be admitted at [facility] where his doctor is  IPt sees Dr. Dorian Heckle in [place].   I spoke to Dr.  Veryl Speak who will admit to [facility] to Medical telemetry.      Lonia Skinner [place], PA-C 05/23/13 2049

## 2013-05-23 NOTE — ED Notes (Signed)
Patient states that he went and saw his dr today and was given an antibiotic, ever since he took it he has had N/V/D. States he knows its the medicine because he's had reactions like this before to medication. However, he did not call the dr office to discuss with them.

## 2013-05-23 NOTE — ED Notes (Signed)
Pt tolerating ice chips.

## 2013-05-23 NOTE — ED Provider Notes (Signed)
Medical screening examination/treatment/procedure(s) were performed by non-physician practitioner and as supervising physician I was immediately available for consultation/collaboration.  Martha K Linker, MD 05/23/13 2057 

## 2013-05-23 NOTE — ED Notes (Signed)
Diarrhea and vomiting x 3 days. Was seen by his MD today and started on Augmentin. Feels worse.

## 2013-05-23 NOTE — ED Notes (Signed)
Patient ambulatory to restroom without difficulty or assistance.

## 2013-09-09 ENCOUNTER — Emergency Department (HOSPITAL_BASED_OUTPATIENT_CLINIC_OR_DEPARTMENT_OTHER): Payer: Medicare Other

## 2013-09-09 ENCOUNTER — Emergency Department (HOSPITAL_BASED_OUTPATIENT_CLINIC_OR_DEPARTMENT_OTHER)
Admission: EM | Admit: 2013-09-09 | Discharge: 2013-09-09 | Disposition: A | Discharge status: Home / Self Care | Payer: Medicare Other | Attending: Emergency Medicine | Admitting: Emergency Medicine

## 2013-09-09 ENCOUNTER — Encounter (HOSPITAL_BASED_OUTPATIENT_CLINIC_OR_DEPARTMENT_OTHER): Payer: Self-pay | Admitting: Emergency Medicine

## 2013-09-09 DIAGNOSIS — E7800 Pure hypercholesterolemia, unspecified: Secondary | ICD-10-CM | POA: Insufficient documentation

## 2013-09-09 DIAGNOSIS — Z21 Asymptomatic human immunodeficiency virus [HIV] infection status: Secondary | ICD-10-CM | POA: Insufficient documentation

## 2013-09-09 DIAGNOSIS — R300 Dysuria: Secondary | ICD-10-CM | POA: Insufficient documentation

## 2013-09-09 DIAGNOSIS — Z79899 Other long term (current) drug therapy: Secondary | ICD-10-CM | POA: Insufficient documentation

## 2013-09-09 DIAGNOSIS — Z792 Long term (current) use of antibiotics: Secondary | ICD-10-CM | POA: Insufficient documentation

## 2013-09-09 DIAGNOSIS — F3289 Other specified depressive episodes: Secondary | ICD-10-CM | POA: Insufficient documentation

## 2013-09-09 DIAGNOSIS — K589 Irritable bowel syndrome without diarrhea: Secondary | ICD-10-CM | POA: Insufficient documentation

## 2013-09-09 DIAGNOSIS — J069 Acute upper respiratory infection, unspecified: Secondary | ICD-10-CM | POA: Insufficient documentation

## 2013-09-09 DIAGNOSIS — F329 Major depressive disorder, single episode, unspecified: Secondary | ICD-10-CM | POA: Insufficient documentation

## 2013-09-09 DIAGNOSIS — G40909 Epilepsy, unspecified, not intractable, without status epilepticus: Secondary | ICD-10-CM | POA: Insufficient documentation

## 2013-09-09 HISTORY — DX: Pure hypercholesterolemia, unspecified: E78.00

## 2013-09-09 LAB — CBC WITH DIFFERENTIAL/PLATELET
BASOS ABS: 0 10*3/uL (ref 0.0–0.1)
BASOS PCT: 0 % (ref 0–1)
EOS ABS: 0.2 10*3/uL (ref 0.0–0.7)
EOS PCT: 2 % (ref 0–5)
HCT: 50 % (ref 39.0–52.0)
Hemoglobin: 16.8 g/dL (ref 13.0–17.0)
LYMPHS PCT: 10 % — AB (ref 12–46)
Lymphs Abs: 0.8 10*3/uL (ref 0.7–4.0)
MCH: 32.6 pg (ref 26.0–34.0)
MCHC: 33.6 g/dL (ref 30.0–36.0)
MCV: 97.1 fL (ref 78.0–100.0)
Monocytes Absolute: 0.7 10*3/uL (ref 0.1–1.0)
Monocytes Relative: 9 % (ref 3–12)
Neutro Abs: 6.2 10*3/uL (ref 1.7–7.7)
Neutrophils Relative %: 79 % — ABNORMAL HIGH (ref 43–77)
PLATELETS: 190 10*3/uL (ref 150–400)
RBC: 5.15 MIL/uL (ref 4.22–5.81)
RDW: 13.1 % (ref 11.5–15.5)
WBC: 7.8 10*3/uL (ref 4.0–10.5)

## 2013-09-09 LAB — COMPREHENSIVE METABOLIC PANEL
ALBUMIN: 3.9 g/dL (ref 3.5–5.2)
ALT: 159 U/L — ABNORMAL HIGH (ref 0–53)
AST: 92 U/L — AB (ref 0–37)
Alkaline Phosphatase: 50 U/L (ref 39–117)
BUN: 20 mg/dL (ref 6–23)
CALCIUM: 9.8 mg/dL (ref 8.4–10.5)
CO2: 23 mEq/L (ref 19–32)
CREATININE: 2.1 mg/dL — AB (ref 0.50–1.35)
Chloride: 99 mEq/L (ref 96–112)
GFR calc Af Amer: 37 mL/min — ABNORMAL LOW (ref >90)
GFR calc non Af Amer: 32 mL/min — ABNORMAL LOW (ref >90)
Glucose, Bld: 90 mg/dL (ref 70–99)
Potassium: 4.4 mEq/L (ref 3.7–5.3)
Sodium: 135 mEq/L — ABNORMAL LOW (ref 137–147)
Total Bilirubin: 0.8 mg/dL (ref 0.3–1.2)
Total Protein: 7.1 g/dL (ref 6.0–8.3)

## 2013-09-09 LAB — URINALYSIS, ROUTINE W REFLEX MICROSCOPIC
GLUCOSE, UA: NEGATIVE mg/dL
Ketones, ur: NEGATIVE mg/dL
LEUKOCYTES UA: NEGATIVE
Nitrite: NEGATIVE
Protein, ur: 100 mg/dL — AB
SPECIFIC GRAVITY, URINE: 1.019 (ref 1.005–1.030)
UROBILINOGEN UA: 1 mg/dL (ref 0.0–1.0)
pH: 7 (ref 5.0–8.0)

## 2013-09-09 LAB — LACTATE DEHYDROGENASE: LDH: 10 U/L — AB (ref 94–250)

## 2013-09-09 LAB — URINE MICROSCOPIC-ADD ON

## 2013-09-09 MED ORDER — DM-GUAIFENESIN ER 30-600 MG PO TB12: 1.0000 | ORAL_TABLET | Freq: Two times a day (BID) | ORAL | Status: DC

## 2013-09-09 MED ORDER — SODIUM CHLORIDE 0.9 % IV BOLUS (SEPSIS): 500.0000 mL | INTRAVENOUS | Status: DC

## 2013-09-09 MED ORDER — FLUTICASONE PROPIONATE 50 MCG/ACT NA SUSP: 2.0000 | Freq: Every day | NASAL | Status: AC

## 2013-09-09 NOTE — ED Notes (Signed)
Patient states he has been coughing up yellow mucus for the past two days and is taking  otc cough meds. Concerned because he  has been having trouble urinating since he started the meds & was admitted approx 5 months ago for renal failure. No fever, no diarrhea

## 2013-09-09 NOTE — ED Provider Notes (Signed)
CSN: [id number]     Arrival date & time 09/09/13  0809 History   First MD Initiated Contact with Patient 09/09/13 [phone]     Chief Complaint  Patient presents with  . URI   HPI  Patient presents with concerns of cough, sinus congestion, new difficulty urinating. Patient's multiple medical problems, including HIV, recent episode of renal failure secondary to GI illness. This episode began several days ago.  Since onset symptoms have been persistent, though there is improvement in the sinus congestion with OTC medication. Consequence of a sore throat. The difficulty urinating started after the patient began his new medication. No new abdominal pain, chest pain, no fever, no chills. His last CD4 count was greater than 400, last viral load was undetectable.    Past Medical History  Diagnosis Date  . IBS (irritable bowel syndrome)   . Depression   . HIV (human immunodeficiency virus infection)   . Seizures   . High cholesterol    Past Surgical History  Procedure Laterality Date  . Joint replacement    . Rotator cuff repair      right  . Knee surgery      right   No family history on file. History  Substance Use Topics  . Smoking status: Never Smoker   . Smokeless tobacco: Not on file  . Alcohol Use: No    Review of Systems  Constitutional:       Per HPI, otherwise negative  HENT:       Per HPI, otherwise negative  Respiratory:       Per HPI, otherwise negative  Cardiovascular:       Per HPI, otherwise negative  Gastrointestinal: Negative for vomiting.  Endocrine:       Negative aside from HPI  Genitourinary:       Neg aside from HPI   Musculoskeletal:       Per HPI, otherwise negative  Skin: Negative.   Neurological: Negative for syncope.    Allergies  Review of patient's allergies indicates no known allergies.  Home Medications   Current Outpatient Rx  Name  Route  Sig  Dispense  Refill  . azithromycin (ZITHROMAX) 250 MG tablet   Oral   Take 1 tablet  (250 mg total) by mouth daily. Take first 2 tablets together, then 1 every day until finished.   6 tablet   0   . buPROPion (WELLBUTRIN SR) 150 MG 12 hr tablet   Oral   Take 150 mg by mouth 2 (two) times daily.           . ciprofloxacin (CIPRO) 500 MG tablet   Oral   Take 1 tablet (500 mg total) by mouth 2 (two) times daily.   28 tablet   0   . diphenoxylate-atropine (LOMOTIL) 2.5-0.025 MG per tablet   Oral   Take 1 tablet by mouth 4 (four) times daily as needed.           Marland Kitchen escitalopram (LEXAPRO) 10 MG tablet   Oral   Take 10 mg by mouth daily.           Marland Kitchen HYDROcodone-acetaminophen (NORCO/VICODIN) 5-325 MG per tablet   Oral   Take 1 tablet by mouth every 6 (six) hours as needed for pain.   7 tablet   0   . ibuprofen (ADVIL,MOTRIN) 600 MG tablet   Oral   Take 1 tablet (600 mg total) by mouth every 6 (six) hours as needed for pain.  30 tablet   0   . lamivudine (EPIVIR) 300 MG tablet   Oral   Take 300 mg by mouth daily.           Marland Kitchen loperamide (IMODIUM A-D) 2 MG tablet   Oral   Take 2 mg by mouth 4 (four) times daily as needed for diarrhea or loose stools.         Marland Kitchen lopinavir-ritonavir (KALETRA) 200-50 MG per tablet   Oral   Take 2 tablets by mouth 2 (two) times daily.           . methocarbamol (ROBAXIN) 500 MG tablet   Oral   Take 500 mg by mouth 4 (four) times daily.           . phenytoin (DILANTIN) 100 MG ER capsule   Oral   Take by mouth 3 (three) times daily.           . pravastatin (PRAVACHOL) 20 MG tablet   Oral   Take 20 mg by mouth daily.         . rosuvastatin (CRESTOR) 10 MG tablet   Oral   Take 10 mg by mouth daily.           . saquinavir (INVIRASE) 500 MG tablet   Oral   Take 1,000 mg by mouth 2 (two) times daily.          BP 119/65  Pulse 81  Temp(Src) 98.3 F (36.8 C) (Oral)  Resp 16  Ht 5\' 7"  (1.702 m)  Wt 160 lb (72.576 kg)  BMI 25.05 kg/m2  SpO2 96% Physical Exam  Nursing note and vitals  reviewed. Constitutional: He is oriented to person, place, and time. He appears well-developed. No distress.  HENT:  Head: Normocephalic and atraumatic.  Mouth/Throat: Uvula is midline, oropharynx is clear and moist and mucous membranes are normal.  Eyes: Conjunctivae and EOM are normal.  Cardiovascular: Normal rate and regular rhythm.   Pulmonary/Chest: Effort normal. No stridor. No respiratory distress.  Abdominal: He exhibits no distension. There is no tenderness.  Musculoskeletal: He exhibits no edema.  Lymphadenopathy:    He has cervical adenopathy.       Right cervical: Superficial cervical adenopathy present.  Neurological: He is alert and oriented to person, place, and time.  Skin: Skin is warm and dry.  Psychiatric: He has a normal mood and affect.    ED Course  Procedures (including critical care time) Labs Review Labs Reviewed  CBC WITH DIFFERENTIAL  COMPREHENSIVE METABOLIC PANEL  URINALYSIS, ROUTINE W REFLEX MICROSCOPIC  LACTATE DEHYDROGENASE   Imaging Review Dg Chest 2 View  09/09/2013   CLINICAL DATA:  Cough, congestion, and discomfort for several days  EXAM: CHEST  2 VIEW  COMPARISON:  11/27/2012  FINDINGS: The heart size and mediastinal contours are within normal limits. Both lungs are clear. The visualized skeletal structures are unremarkable.  IMPRESSION: No active cardiopulmonary disease.   Electronically Signed   By: Esperanza Heir M.D.   On: 09/09/2013 08:59    EKG Interpretation   None       10:43 AM On repeat exam the patient appears well.  He is walking, speaking clearly, in no distress.  We discussed all findings, the need for followup.  MDM  No diagnosis found. Patient presents with concerns of ongoing sinus congestion, cough.  On exam patient is awake and alert, afebrile.  Given his history, labs, x-ray performed.  Patient's evaluation was largely reassuring.  With any ongoing complaints  he was started on a short course of medication for symptom  relief.  He was discharged in stable condition to follow up with his primary care physician.    Gerhard Munchobert Tamarah Bhullar, MD 09/09/13 1044

## 2013-09-09 NOTE — ED Notes (Signed)
Patient states he does not fluids at this time

## 2013-09-14 ENCOUNTER — Emergency Department (HOSPITAL_BASED_OUTPATIENT_CLINIC_OR_DEPARTMENT_OTHER)
Admission: EM | Admit: 2013-09-14 | Discharge: 2013-09-14 | Disposition: A | Discharge status: Home / Self Care | Payer: Medicare Other | Attending: Emergency Medicine | Admitting: Emergency Medicine

## 2013-09-14 ENCOUNTER — Encounter (HOSPITAL_BASED_OUTPATIENT_CLINIC_OR_DEPARTMENT_OTHER): Payer: Self-pay | Admitting: Emergency Medicine

## 2013-09-14 DIAGNOSIS — J069 Acute upper respiratory infection, unspecified: Secondary | ICD-10-CM | POA: Insufficient documentation

## 2013-09-14 DIAGNOSIS — IMO0002 Reserved for concepts with insufficient information to code with codable children: Secondary | ICD-10-CM | POA: Insufficient documentation

## 2013-09-14 DIAGNOSIS — G40909 Epilepsy, unspecified, not intractable, without status epilepticus: Secondary | ICD-10-CM | POA: Insufficient documentation

## 2013-09-14 DIAGNOSIS — J329 Chronic sinusitis, unspecified: Secondary | ICD-10-CM

## 2013-09-14 DIAGNOSIS — Z21 Asymptomatic human immunodeficiency virus [HIV] infection status: Secondary | ICD-10-CM | POA: Insufficient documentation

## 2013-09-14 DIAGNOSIS — Z792 Long term (current) use of antibiotics: Secondary | ICD-10-CM | POA: Insufficient documentation

## 2013-09-14 DIAGNOSIS — Z79899 Other long term (current) drug therapy: Secondary | ICD-10-CM | POA: Insufficient documentation

## 2013-09-14 DIAGNOSIS — F329 Major depressive disorder, single episode, unspecified: Secondary | ICD-10-CM | POA: Insufficient documentation

## 2013-09-14 DIAGNOSIS — Z8719 Personal history of other diseases of the digestive system: Secondary | ICD-10-CM | POA: Insufficient documentation

## 2013-09-14 DIAGNOSIS — E7800 Pure hypercholesterolemia, unspecified: Secondary | ICD-10-CM | POA: Insufficient documentation

## 2013-09-14 DIAGNOSIS — F3289 Other specified depressive episodes: Secondary | ICD-10-CM | POA: Insufficient documentation

## 2013-09-14 MED ORDER — AZITHROMYCIN 250 MG PO TABS: 250.0000 mg | ORAL_TABLET | Freq: Every day | ORAL | Status: DC

## 2013-09-14 NOTE — Discharge Instructions (Signed)

## 2013-09-14 NOTE — ED Notes (Signed)
C/o head and chest congestion-states he ws seen here for same-taking meds and no better-NAD

## 2013-09-14 NOTE — ED Provider Notes (Signed)
CSN: [id number]     Arrival date & time 09/14/13  1927 History  This chart was scribed for Gwyneth Sprout, MD by Dorothey Baseman, ED Scribe. This patient was seen in room MH06/MH06 and the patient's care was started at 8:04 PM.    Chief Complaint  Patient presents with  . URI   The history is provided by the patient. No language interpreter was used.   HPI Comments: Joseph Humphrey is a 63 y.o. Male with a history of HIV who presents to the Emergency Department complaining of congestion, sinus pressure, and a mild, dry cough onset about 3-4 days ago. He reports some associated pain just inferior to the left eye onset earlier today. Patient was seen here on 09/09/2013 (5 days ago) for similar complaints and received a chest x-ray that was negative. Patient was discharged with Mucinex and Flonase, which he states was not effective at providing significant relief. He also reports taking OTC medications at home, which he states caused some urinary retention that resolved after he stopped taking the medication. He denies fever, shortness of breath. Patient also has a history of hypercholesterolemia.   Past Medical History  Diagnosis Date  . IBS (irritable bowel syndrome)   . Depression   . HIV (human immunodeficiency virus infection)   . Seizures   . High cholesterol    Past Surgical History  Procedure Laterality Date  . Joint replacement    . Rotator cuff repair      right  . Knee surgery      right   No family history on file. History  Substance Use Topics  . Smoking status: Never Smoker   . Smokeless tobacco: Not on file  . Alcohol Use: No    Review of Systems  A complete 10 system review of systems was obtained and all systems are negative except as noted in the HPI and PMH.   Allergies  Review of patient's allergies indicates no known allergies.  Home Medications   Current Outpatient Rx  Name  Route  Sig  Dispense  Refill  . azithromycin (ZITHROMAX) 250 MG tablet    Oral   Take 1 tablet (250 mg total) by mouth daily. Take first 2 tablets together, then 1 every day until finished.   6 tablet   0   . buPROPion (WELLBUTRIN SR) 150 MG 12 hr tablet   Oral   Take 150 mg by mouth 2 (two) times daily.           . ciprofloxacin (CIPRO) 500 MG tablet   Oral   Take 1 tablet (500 mg total) by mouth 2 (two) times daily.   28 tablet   0   . dextromethorphan-guaiFENesin (MUCINEX DM) 30-600 MG per 12 hr tablet   Oral   Take 1 tablet by mouth 2 (two) times daily.   10 tablet   0   . diphenoxylate-atropine (LOMOTIL) 2.5-0.025 MG per tablet   Oral   Take 1 tablet by mouth 4 (four) times daily as needed.           Marland Kitchen escitalopram (LEXAPRO) 10 MG tablet   Oral   Take 10 mg by mouth daily.           Marland Kitchen EXPIRED: fluticasone (FLONASE) 50 MCG/ACT nasal spray   Each Nare   Place 2 sprays into both nostrils daily. For three days   16 g   0   . HYDROcodone-acetaminophen (NORCO/VICODIN) 5-325 MG per tablet   Oral  Take 1 tablet by mouth every 6 (six) hours as needed for pain.   7 tablet   0   . ibuprofen (ADVIL,MOTRIN) 600 MG tablet   Oral   Take 1 tablet (600 mg total) by mouth every 6 (six) hours as needed for pain.   30 tablet   0   . lamivudine (EPIVIR) 300 MG tablet   Oral   Take 300 mg by mouth daily.           Marland Kitchen. loperamide (IMODIUM A-D) 2 MG tablet   Oral   Take 2 mg by mouth 4 (four) times daily as needed for diarrhea or loose stools.         Marland Kitchen. lopinavir-ritonavir (KALETRA) 200-50 MG per tablet   Oral   Take 2 tablets by mouth 2 (two) times daily.           . methocarbamol (ROBAXIN) 500 MG tablet   Oral   Take 500 mg by mouth 4 (four) times daily.           . phenytoin (DILANTIN) 100 MG ER capsule   Oral   Take by mouth 3 (three) times daily.           . pravastatin (PRAVACHOL) 20 MG tablet   Oral   Take 20 mg by mouth daily.         . rosuvastatin (CRESTOR) 10 MG tablet   Oral   Take 10 mg by mouth daily.            . saquinavir (INVIRASE) 500 MG tablet   Oral   Take 1,000 mg by mouth 2 (two) times daily.          Triage Vitals: BP 139/73  Pulse 87  Temp(Src) 98.7 F (37.1 C) (Oral)  Resp 18  Ht 5\' 7"  (1.702 m)  Wt 160 lb (72.576 kg)  BMI 25.05 kg/m2  SpO2 99%  Physical Exam  Nursing note and vitals reviewed. Constitutional: He is oriented to person, place, and time. He appears well-developed and well-nourished. No distress.  HENT:  Head: Normocephalic and atraumatic.  Right Ear: Hearing, tympanic membrane, external ear and ear canal normal.  Left Ear: Hearing, tympanic membrane, external ear and ear canal normal.  Mouth/Throat: Oropharynx is clear and moist.  Mild tenderness to palpation to the left maxillary sinus. Mildly deviated septum on the left. Nasal mucosa edema.   Eyes: Conjunctivae are normal.  Neck: Normal range of motion. Neck supple.  Cardiovascular: Normal rate, regular rhythm and normal heart sounds.   Pulmonary/Chest: Effort normal and breath sounds normal. No respiratory distress.  Abdominal: He exhibits no distension.  Musculoskeletal: Normal range of motion.  Neurological: He is alert and oriented to person, place, and time.  Skin: Skin is warm and dry.  Psychiatric: He has a normal mood and affect. His behavior is normal.    ED Course  Procedures (including critical care time)  DIAGNOSTIC STUDIES: Oxygen Saturation is 99% on room air, normal by my interpretation.    COORDINATION OF CARE: 8:10 PM- Discussed that symptoms are likely due to a sinusitis. Will discharge patient with Zithromax to manage symptoms. Discussed treatment plan with patient at bedside and patient verbalized agreement.     Labs Review Labs Reviewed - No data to display Imaging Review No results found.  EKG Interpretation   None       MDM   1. Sinusitis     Patient presenting with symptoms consistent with sinusitis that are  not improving with supportive care  including Flonase and Mucinex. He is now having localized left maxillary tenderness. No other complicating features. Patient given a Z-Pak  I personally performed the services described in this documentation, which was scribed in my presence.  The recorded information has been reviewed and considered.     Gwyneth Sprout, MD 09/14/13 2022

## 2014-09-25 IMAGING — CR DG CHEST 2V
2 series · 2 of 2 positions shown · non-contrast
Comparison: 11/27/2012

CLINICAL DATA: Cough, congestion, and discomfort for several days

EXAM:
CHEST  2 VIEW

[w chest pa]
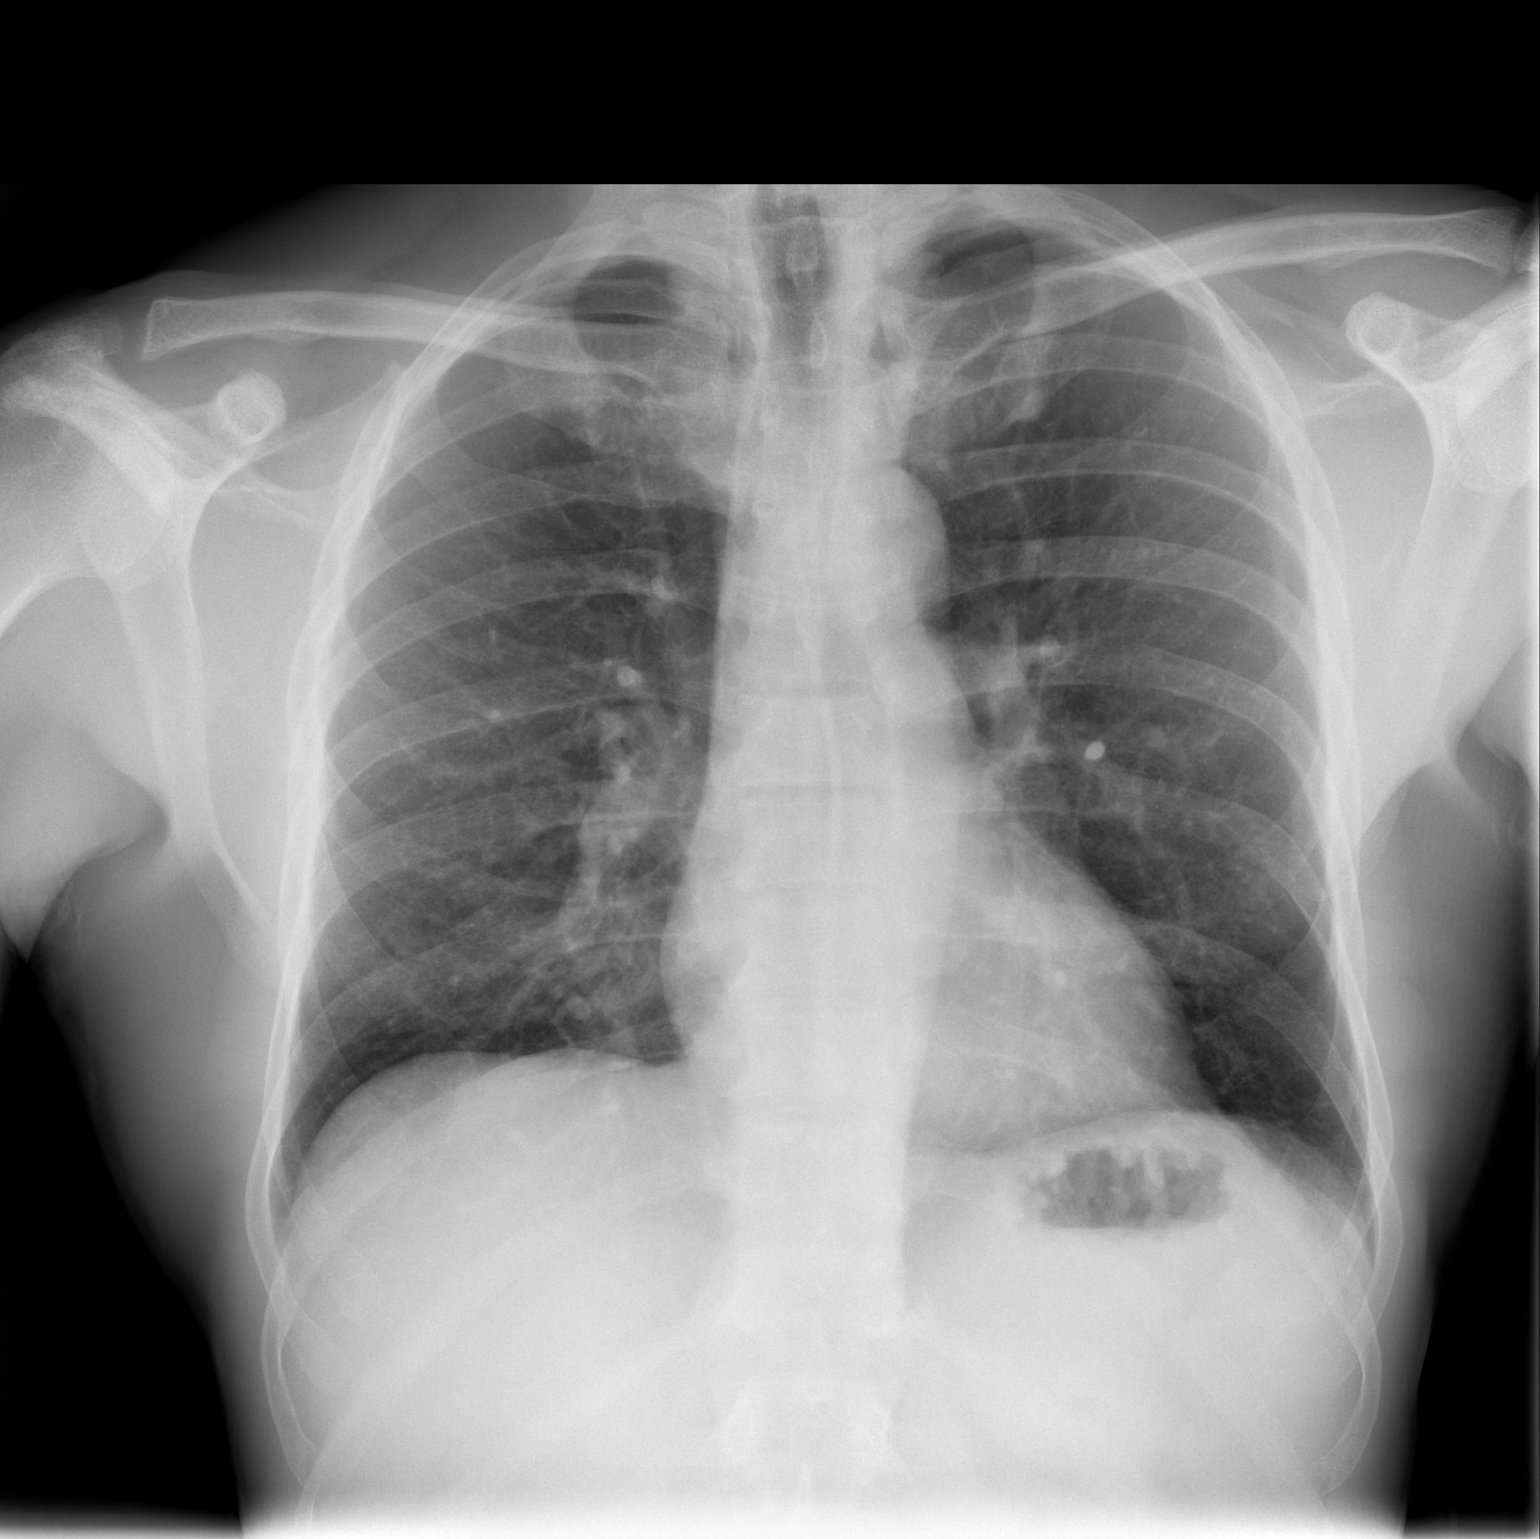

[w chest lat]
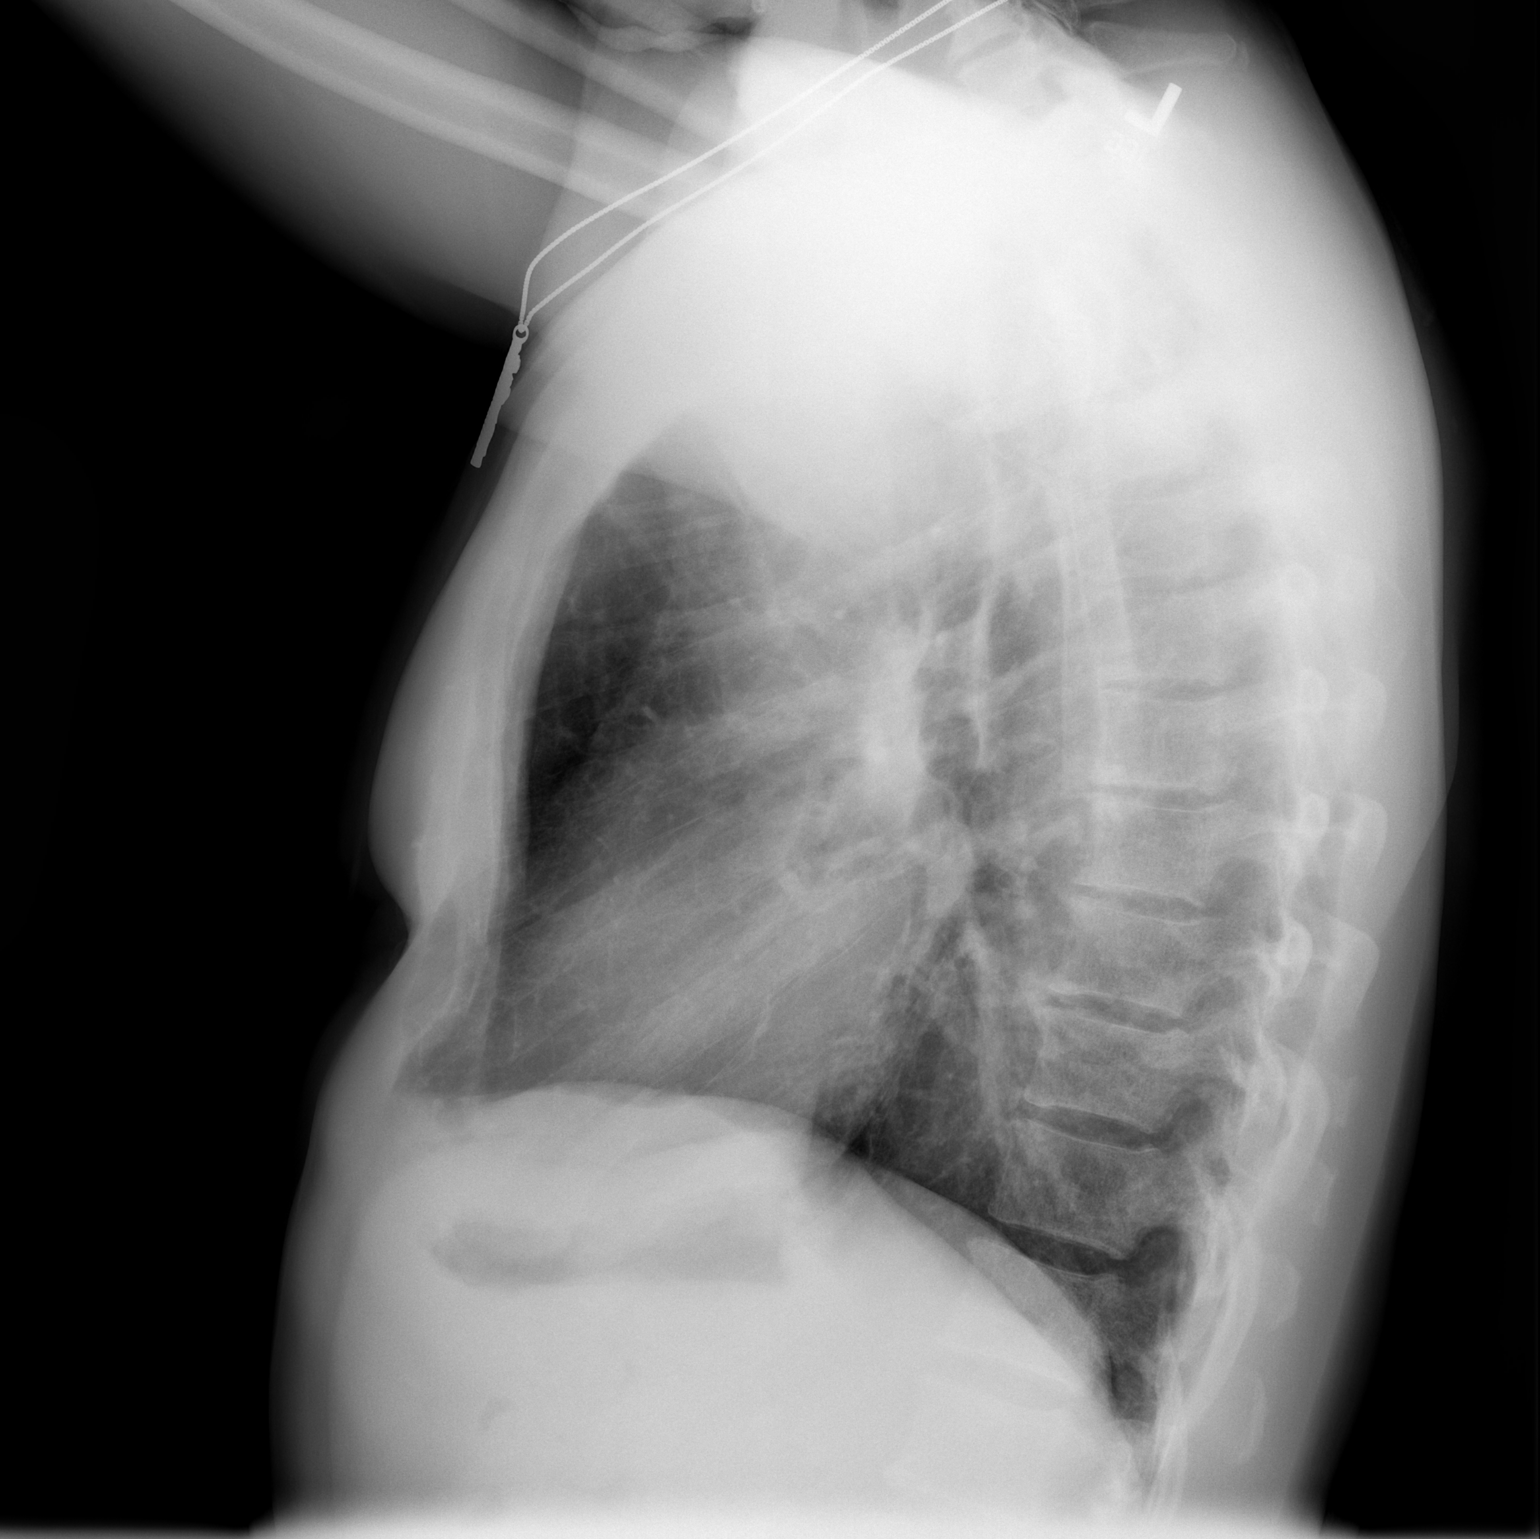

[2 of 2 positions shown; findings below may reference images not displayed]

FINDINGS: The heart size and mediastinal contours are within normal limits.
Both lungs are clear. The visualized skeletal structures are
unremarkable.
IMPRESSION: No active cardiopulmonary disease.

## 2015-03-05 ENCOUNTER — Ambulatory Visit (INDEPENDENT_AMBULATORY_CARE_PROVIDER_SITE_OTHER): Payer: Medicare Other | Admitting: Neurology

## 2015-03-05 ENCOUNTER — Ambulatory Visit (INDEPENDENT_AMBULATORY_CARE_PROVIDER_SITE_OTHER): Payer: Self-pay | Admitting: Neurology

## 2015-03-05 ENCOUNTER — Encounter: Payer: Self-pay | Admitting: Neurology

## 2015-03-05 DIAGNOSIS — R202 Paresthesia of skin: Secondary | ICD-10-CM

## 2015-03-05 HISTORY — DX: Paresthesia of skin: R20.2

## 2015-03-05 NOTE — Procedures (Signed)
     HISTORY:  Joseph Humphrey is a 64 year old gentleman with a history of HIV infection to also has noted a six-month history of paresthesias involving the left hand. He denies any neck or shoulder discomfort. He is being evaluated for a possible neuropathy or a cervical radiculopathy.  NERVE CONDUCTION STUDIES:  Nerve conduction studies were performed on the left upper extremity. The distal motor latencies and motor amplitudes for the median and ulnar nerves were within normal limits. The F wave latencies and nerve conduction velocities for these nerves were also normal. The sensory latencies for the median, radial, and ulnar nerves were normal.   EMG STUDIES:  EMG study was performed on the left upper extremity:  The first dorsal interosseous muscle reveals 2 to 4 K units with full recruitment. No fibrillations or positive waves were noted. The abductor pollicis brevis muscle reveals 2 to 4 K units with full recruitment. No fibrillations or positive waves were noted. The extensor indicis proprius muscle reveals 1 to 3 K units with full recruitment. No fibrillations or positive waves were noted. The pronator teres muscle reveals 2 to 3 K units with full recruitment. No fibrillations or positive waves were noted. The biceps muscle reveals 1 to 2 K units with full recruitment. No fibrillations or positive waves were noted. The triceps muscle reveals 2 to 4 K units with full recruitment. No fibrillations or positive waves were noted. The anterior deltoid muscle reveals 2 to 3 K units with full recruitment. No fibrillations or positive waves were noted. The cervical paraspinal muscles were tested at 2 levels. No abnormalities of insertional activity were seen at either level tested. There was good relaxation.   IMPRESSION:  Nerve conduction studies done on the left upper extremity were unremarkable, no evidence of carpal tunnel syndrome is seen. EMG evaluation of the left upper extremity  was unremarkable, there is no evidence of an overlying cervical radiculopathy.  Marlan Palau. Keith Naileah Karg MD 03/05/2015 4:41 PM  [facility] [address] [place], [place] [id number]  Phone [phone] Fax [phone]

## 2015-03-05 NOTE — Progress Notes (Signed)
Please refer to EMG and nerve conduction study procedure note. 

## 2015-10-07 ENCOUNTER — Encounter (HOSPITAL_BASED_OUTPATIENT_CLINIC_OR_DEPARTMENT_OTHER): Payer: Self-pay | Admitting: *Deleted

## 2015-10-07 ENCOUNTER — Emergency Department (HOSPITAL_BASED_OUTPATIENT_CLINIC_OR_DEPARTMENT_OTHER)
Admission: EM | Admit: 2015-10-07 | Discharge: 2015-10-07 | Disposition: A | Discharge status: Home / Self Care | Payer: Medicare Other | Attending: Emergency Medicine | Admitting: Emergency Medicine

## 2015-10-07 DIAGNOSIS — R1030 Lower abdominal pain, unspecified: Secondary | ICD-10-CM | POA: Insufficient documentation

## 2015-10-07 DIAGNOSIS — Z8719 Personal history of other diseases of the digestive system: Secondary | ICD-10-CM | POA: Diagnosis not present

## 2015-10-07 DIAGNOSIS — Z79899 Other long term (current) drug therapy: Secondary | ICD-10-CM | POA: Insufficient documentation

## 2015-10-07 DIAGNOSIS — F329 Major depressive disorder, single episode, unspecified: Secondary | ICD-10-CM | POA: Insufficient documentation

## 2015-10-07 DIAGNOSIS — R112 Nausea with vomiting, unspecified: Secondary | ICD-10-CM | POA: Insufficient documentation

## 2015-10-07 DIAGNOSIS — J019 Acute sinusitis, unspecified: Secondary | ICD-10-CM

## 2015-10-07 DIAGNOSIS — Z7951 Long term (current) use of inhaled steroids: Secondary | ICD-10-CM | POA: Diagnosis not present

## 2015-10-07 DIAGNOSIS — Z21 Asymptomatic human immunodeficiency virus [HIV] infection status: Secondary | ICD-10-CM | POA: Diagnosis not present

## 2015-10-07 DIAGNOSIS — E7800 Pure hypercholesterolemia, unspecified: Secondary | ICD-10-CM | POA: Insufficient documentation

## 2015-10-07 DIAGNOSIS — R509 Fever, unspecified: Secondary | ICD-10-CM | POA: Insufficient documentation

## 2015-10-07 DIAGNOSIS — B20 Human immunodeficiency virus [HIV] disease: Secondary | ICD-10-CM

## 2015-10-07 LAB — CBC WITH DIFFERENTIAL/PLATELET
BASOS ABS: 0 10*3/uL (ref 0.0–0.1)
Basophils Relative: 0 %
EOS ABS: 0 10*3/uL (ref 0.0–0.7)
Eosinophils Relative: 0 %
HCT: 52.9 % — ABNORMAL HIGH (ref 39.0–52.0)
HEMOGLOBIN: 18.2 g/dL — AB (ref 13.0–17.0)
LYMPHS PCT: 5 %
Lymphs Abs: 0.3 10*3/uL — ABNORMAL LOW (ref 0.7–4.0)
MCH: 34.6 pg — ABNORMAL HIGH (ref 26.0–34.0)
MCHC: 34.4 g/dL (ref 30.0–36.0)
MCV: 100.6 fL — ABNORMAL HIGH (ref 78.0–100.0)
Monocytes Absolute: 0.5 10*3/uL (ref 0.1–1.0)
Monocytes Relative: 9 %
NEUTROS PCT: 86 %
Neutro Abs: 5.2 10*3/uL (ref 1.7–7.7)
Platelets: 111 10*3/uL — ABNORMAL LOW (ref 150–400)
RBC: 5.26 MIL/uL (ref 4.22–5.81)
RDW: 13 % (ref 11.5–15.5)
WBC: 6 10*3/uL (ref 4.0–10.5)

## 2015-10-07 LAB — COMPREHENSIVE METABOLIC PANEL
ALK PHOS: 66 U/L (ref 38–126)
ALT: 43 U/L (ref 17–63)
ANION GAP: 12 (ref 5–15)
AST: 41 U/L (ref 15–41)
Albumin: 4.4 g/dL (ref 3.5–5.0)
BILIRUBIN TOTAL: 1.2 mg/dL (ref 0.3–1.2)
BUN: 15 mg/dL (ref 6–20)
CALCIUM: 9 mg/dL (ref 8.9–10.3)
CO2: 19 mmol/L — AB (ref 22–32)
Chloride: 102 mmol/L (ref 101–111)
Creatinine, Ser: 1.73 mg/dL — ABNORMAL HIGH (ref 0.61–1.24)
GFR calc non Af Amer: 40 mL/min — ABNORMAL LOW (ref >60)
GFR, EST AFRICAN AMERICAN: 47 mL/min — AB (ref >60)
Glucose, Bld: 157 mg/dL — ABNORMAL HIGH (ref 65–99)
Potassium: 3.9 mmol/L (ref 3.5–5.1)
SODIUM: 133 mmol/L — AB (ref 135–145)
TOTAL PROTEIN: 7.5 g/dL (ref 6.5–8.1)

## 2015-10-07 MED ORDER — SODIUM CHLORIDE 0.9 % IV BOLUS (SEPSIS)
1000.0000 mL | Freq: Once | INTRAVENOUS | Status: AC
  Administered 2015-10-07: 1000 mL via INTRAVENOUS

## 2015-10-07 MED ORDER — ONDANSETRON 8 MG PO TBDP: ORAL_TABLET | ORAL | Status: DC

## 2015-10-07 MED ORDER — ONDANSETRON HCL 4 MG/2ML IJ SOLN
4.0000 mg | Freq: Once | INTRAMUSCULAR | Status: AC
  Administered 2015-10-07: 4 mg via INTRAVENOUS
  Filled 2015-10-07: qty 2

## 2015-10-07 MED ORDER — MOXIFLOXACIN HCL 400 MG PO TABS: 400.0000 mg | ORAL_TABLET | Freq: Every day | ORAL | Status: DC

## 2015-10-07 NOTE — ED Provider Notes (Signed)
CSN: [id number]     Arrival date & time 10/07/15  1847 History  By signing my name below, I, Linus Galas, attest that this documentation has been prepared under the direction and in the presence of Geoffery Lyons, MD. Electronically Signed: Linus Galas, ED Scribe. 10/07/2015. 8:01 PM.   Chief Complaint  Patient presents with  . Medication Reaction  . Emesis   The history is provided by the patient. No language interpreter was used.   HPI Comments: Joseph Humphrey is a 65 y.o. male with a PMHx of HIV, HLD, and seizures who presents to the Emergency Department complaining of vomiting s/p taking Augmentin, PTA . Pt reports he took his first dose of Augmentin for his sinus infection and vomited about 2 hours afterwards. He states he feels like he is having a "reaction" to it. Pt also reports fever of Tmax 100.6 F that began last night. Pt denies any diarrhea or any other symptoms at this time.     Past Medical History  Diagnosis Date  . IBS (irritable bowel syndrome)   . Depression   . HIV (human immunodeficiency virus infection) (HCC)   . Seizures (HCC)   . High cholesterol   . Paresthesias 03/05/2015    Left hand   Past Surgical History  Procedure Laterality Date  . Joint replacement    . Rotator cuff repair      right  . Knee surgery      right   No family history on file. Social History  Substance Use Topics  . Smoking status: Never Smoker   . Smokeless tobacco: None  . Alcohol Use: No    Review of Systems  Constitutional: Positive for fever.  Gastrointestinal: Positive for vomiting. Negative for diarrhea.  All other systems reviewed and are negative.  Allergies  Review of patient's allergies indicates no known allergies.  Home Medications   Prior to Admission medications   Medication Sig Start Date End Date Taking? Authorizing Provider  azithromycin (ZITHROMAX) 250 MG tablet Take 1 tablet (250 mg total) by mouth daily. Take first 2 tablets together, then 1  every day until finished. 11/27/12   Heather Laisure, PA-C  azithromycin (ZITHROMAX) 250 MG tablet Take 1 tablet (250 mg total) by mouth daily. Take first 2 tablets together, then 1 every day until finished. 09/14/13   Gwyneth Sprout, MD  buPROPion (WELLBUTRIN SR) 150 MG 12 hr tablet Take 150 mg by mouth 2 (two) times daily.      Historical Provider, MD  ciprofloxacin (CIPRO) 500 MG tablet Take 1 tablet (500 mg total) by mouth 2 (two) times daily. 05/20/12   Susy Frizzle, MD  dextromethorphan-guaiFENesin [facility] DM) 30-600 MG per 12 hr tablet Take 1 tablet by mouth 2 (two) times daily. 09/09/13   Gerhard Munch, MD  diphenoxylate-atropine (LOMOTIL) 2.5-0.025 MG per tablet Take 1 tablet by mouth 4 (four) times daily as needed.      Historical Provider, MD  escitalopram (LEXAPRO) 10 MG tablet Take 10 mg by mouth daily.      Historical Provider, MD  fluticasone (FLONASE) 50 MCG/ACT nasal spray Place 2 sprays into both nostrils daily. For three days 09/09/13 09/12/13  Gerhard Munch, MD  HYDROcodone-acetaminophen (NORCO/VICODIN) 5-325 MG per tablet Take 1 tablet by mouth every 6 (six) hours as needed for pain. 04/30/13   Antony Madura, PA-C  ibuprofen (ADVIL,MOTRIN) 600 MG tablet Take 1 tablet (600 mg total) by mouth every 6 (six) hours as needed for pain. 04/30/13   Tresa Endo  Humes, PA-C  lamivudine (EPIVIR) 300 MG tablet Take 300 mg by mouth daily.      Historical Provider, MD  loperamide (IMODIUM A-D) 2 MG tablet Take 2 mg by mouth 4 (four) times daily as needed for diarrhea or loose stools.    Historical Provider, MD  lopinavir-ritonavir (KALETRA) 200-50 MG per tablet Take 2 tablets by mouth 2 (two) times daily.      Historical Provider, MD  methocarbamol (ROBAXIN) 500 MG tablet Take 500 mg by mouth 4 (four) times daily.      Historical Provider, MD  phenytoin (DILANTIN) 100 MG ER capsule Take by mouth 3 (three) times daily.      Historical Provider, MD  pravastatin (PRAVACHOL) 20 MG tablet Take 20 mg by mouth  daily.    Historical Provider, MD  rosuvastatin (CRESTOR) 10 MG tablet Take 10 mg by mouth daily.      Historical Provider, MD  saquinavir (INVIRASE) 500 MG tablet Take 1,000 mg by mouth 2 (two) times daily.    Historical Provider, MD   BP 106/70 mmHg  Pulse 104  Temp(Src) 98 F (36.7 C) (Oral)  Resp 16  Ht  (1.702 m)  Wt 149 lb (67.586 kg)  BMI 23.33 kg/m2  SpO2 97%   Physical Exam  Constitutional: He is oriented to person, place, and time. He appears well-developed and well-nourished.  HENT:  Head: Normocephalic and atraumatic.  Mouth/Throat: Oropharynx is clear and moist.  Neck: Normal range of motion. Neck supple.  Cardiovascular: Normal rate, regular rhythm and normal heart sounds.   No murmur heard. Pulmonary/Chest: Effort normal. No respiratory distress. He has no wheezes. He has no rales.  Abdominal: Soft. Bowel sounds are normal. He exhibits no distension. There is tenderness.  Mild suprapubic tenderness.   Neurological: He is alert and oriented to person, place, and time.  Skin: Skin is warm and dry.  Psychiatric: He has a normal mood and affect.  Nursing note and vitals reviewed.   ED Course  Procedures   DIAGNOSTIC STUDIES: Oxygen Saturation is 97% on room air, normal by my interpretation.    COORDINATION OF CARE: 7:52 PM Will give fluids and Zofran. Will order blood work.  Discussed treatment plan with pt at bedside and pt agreed to plan.  Labs Review Labs Reviewed - No data to display  Imaging Review No results found. I have personally reviewed and evaluated these images and lab results as part of my medical decision-making.    MDM   Final diagnoses:  None   Patient is a 65 year old male with history of HIV disease. He presents for evaluation of vomiting. He reports being diagnosed with sinusitis several days ago. He was prescribed Augmentin. He took a dose of this this evening, then began vomiting proximally 1-2 hours later. He denies any  abdominal pain or diarrhea. His physical examination is unremarkable. He was given IV fluids and anti-emetics and is feeling markedly improved. He developed a low-grade fever in the emergency department of 100.3. I am uncertain as to the exact etiology of this is suspected is related to the sinusitis or possibly gastroenteritis. I will change his antibiotic to Avelox and discharged him to home with when necessary return.   I personally performed the services described in this documentation, which was scribed in my presence. The recorded information has been reviewed and is accurate.     Geoffery Lyons, MD 10/07/15 2141

## 2015-10-07 NOTE — Discharge Instructions (Signed)
Avelox as prescribed.  Zofran as prescribed as needed for nausea.  Return to the emergency department if you develop bloody stool, severe abdominal pain, difficulty breathing, or other new and concerning symptoms.   Sinusitis, Adult Sinusitis is redness, soreness, and inflammation of the paranasal sinuses. Paranasal sinuses are air pockets within the bones of your face. They are located beneath your eyes, in the middle of your forehead, and above your eyes. In healthy paranasal sinuses, mucus is able to drain out, and air is able to circulate through them by way of your nose. However, when your paranasal sinuses are inflamed, mucus and air can become trapped. This can allow bacteria and other germs to grow and cause infection. Sinusitis can develop quickly and last only a short time (acute) or continue over a long period (chronic). Sinusitis that lasts for more than 12 weeks is considered chronic. CAUSES Causes of sinusitis include:  Allergies.  Structural abnormalities, such as displacement of the cartilage that separates your nostrils (deviated septum), which can decrease the air flow through your nose and sinuses and affect sinus drainage.  Functional abnormalities, such as when the small hairs (cilia) that line your sinuses and help remove mucus do not work properly or are not present. SIGNS AND SYMPTOMS Symptoms of acute and chronic sinusitis are the same. The primary symptoms are pain and pressure around the affected sinuses. Other symptoms include:  Upper toothache.  Earache.  Headache.  Bad breath.  Decreased sense of smell and taste.  A cough, which worsens when you are lying flat.  Fatigue.  Fever.  Thick drainage from your nose, which often is green and may contain pus (purulent).  Swelling and warmth over the affected sinuses. DIAGNOSIS Your health care provider will perform a physical exam. During your exam, your health care provider may perform any of the  following to help determine if you have acute sinusitis or chronic sinusitis:  Look in your nose for signs of abnormal growths in your nostrils (nasal polyps).  Tap over the affected sinus to check for signs of infection.  View the inside of your sinuses using an imaging device that has a light attached (endoscope). If your health care provider suspects that you have chronic sinusitis, one or more of the following tests may be recommended:  Allergy tests.  Nasal culture. A sample of mucus is taken from your nose, sent to a lab, and screened for bacteria.  Nasal cytology. A sample of mucus is taken from your nose and examined by your health care provider to determine if your sinusitis is related to an allergy. TREATMENT Most cases of acute sinusitis are related to a viral infection and will resolve on their own within 10 days. Sometimes, medicines are prescribed to help relieve symptoms of both acute and chronic sinusitis. These may include pain medicines, decongestants, nasal steroid sprays, or saline sprays. However, for sinusitis related to a bacterial infection, your health care provider will prescribe antibiotic medicines. These are medicines that will help kill the bacteria causing the infection. Rarely, sinusitis is caused by a fungal infection. In these cases, your health care provider will prescribe antifungal medicine. For some cases of chronic sinusitis, surgery is needed. Generally, these are cases in which sinusitis recurs more than 3 times per year, despite other treatments. HOME CARE INSTRUCTIONS  Drink plenty of water. Water helps thin the mucus so your sinuses can drain more easily.  Use a humidifier.  Inhale steam 3-4 times a day (for example, sit  in the bathroom with the shower running).  Apply a warm, moist washcloth to your face 3-4 times a day, or as directed by your health care provider.  Use saline nasal sprays to help moisten and clean your sinuses.  Take  medicines only as directed by your health care provider.  If you were prescribed either an antibiotic or antifungal medicine, finish it all even if you start to feel better. SEEK IMMEDIATE MEDICAL CARE IF:  You have increasing pain or severe headaches.  You have nausea, vomiting, or drowsiness.  You have swelling around your face.  You have vision problems.  You have a stiff neck.  You have difficulty breathing.   This information is not intended to replace advice given to you by your health care provider. Make sure you discuss any questions you have with your health care provider.   Document Released: 08/10/2005 Document Revised: 08/31/2014 Document Reviewed: 08/25/2011 Elsevier Interactive Patient Education 2016 [employer]  Nausea and Vomiting Nausea is a sick feeling that often comes before throwing up (vomiting). Vomiting is a reflex where stomach contents come out of your mouth. Vomiting can cause severe loss of body fluids (dehydration). Children and elderly adults can become dehydrated quickly, especially if they also have diarrhea. Nausea and vomiting are symptoms of a condition or disease. It is important to find the cause of your symptoms. CAUSES   Direct irritation of the stomach lining. This irritation can result from increased acid production (gastroesophageal reflux disease), infection, food poisoning, taking certain medicines (such as nonsteroidal anti-inflammatory drugs), alcohol use, or tobacco use.  Signals from the brain.These signals could be caused by a headache, heat exposure, an inner ear disturbance, increased pressure in the brain from injury, infection, a tumor, or a concussion, pain, emotional stimulus, or metabolic problems.  An obstruction in the gastrointestinal tract (bowel obstruction).  Illnesses such as diabetes, hepatitis, gallbladder problems, appendicitis, kidney problems, cancer, sepsis, atypical symptoms of a heart attack, or eating  disorders.  Medical treatments such as chemotherapy and radiation.  Receiving medicine that makes you sleep (general anesthetic) during surgery. DIAGNOSIS Your caregiver may ask for tests to be done if the problems do not improve after a few days. Tests may also be done if symptoms are severe or if the reason for the nausea and vomiting is not clear. Tests may include:  Urine tests.  Blood tests.  Stool tests.  Cultures (to look for evidence of infection).  X-rays or other imaging studies. Test results can help your caregiver make decisions about treatment or the need for additional tests. TREATMENT You need to stay well hydrated. Drink frequently but in small amounts.You may wish to drink water, sports drinks, clear broth, or eat frozen ice pops or gelatin dessert to help stay hydrated.When you eat, eating slowly may help prevent nausea.There are also some antinausea medicines that may help prevent nausea. HOME CARE INSTRUCTIONS   Take all medicine as directed by your caregiver.  If you do not have an appetite, do not force yourself to eat. However, you must continue to drink fluids.  If you have an appetite, eat a normal diet unless your caregiver tells you differently.  Eat a variety of complex carbohydrates (rice, wheat, potatoes, bread), lean meats, yogurt, fruits, and vegetables.  Avoid high-fat foods because they are more difficult to digest.  Drink enough water and fluids to keep your urine clear or pale yellow.  If you are dehydrated, ask your caregiver for specific rehydration instructions. Signs  of dehydration may include:  Severe thirst.  Dry lips and mouth.  Dizziness.  Dark urine.  Decreasing urine frequency and amount.  Confusion.  Rapid breathing or pulse. SEEK IMMEDIATE MEDICAL CARE IF:   You have blood or brown flecks (like coffee grounds) in your vomit.  You have black or bloody stools.  You have a severe headache or stiff neck.  You are  confused.  You have severe abdominal pain.  You have chest pain or trouble breathing.  You do not urinate at least once every 8 hours.  You develop cold or clammy skin.  You continue to vomit for longer than 24 to 48 hours.  You have a fever. MAKE SURE YOU:   Understand these instructions.  Will watch your condition.  Will get help right away if you are not doing well or get worse.   This information is not intended to replace advice given to you by your health care provider. Make sure you discuss any questions you have with your health care provider.   Document Released: 08/10/2005 Document Revised: 11/02/2011 Document Reviewed: 01/07/2011 Elsevier Interactive Patient Education [employer]

## 2015-10-07 NOTE — ED Notes (Signed)
States he was started on Augmentin for sinus infection today. He has been vomiting since taking the medication.

## 2015-10-07 NOTE — ED Notes (Signed)
Pt vomiting after taking Augmentin that was prescribed from dr today after being dx with acute sinusitis.  Pt states he hadn't eaten all day then took the antibiotic and thinks that what made him sick.  Pt states he has been vomiting since 5pm

## 2015-10-07 NOTE — ED Notes (Signed)
MD at bedside. 

## 2016-08-04 ENCOUNTER — Emergency Department (HOSPITAL_BASED_OUTPATIENT_CLINIC_OR_DEPARTMENT_OTHER): Payer: Medicare Other

## 2016-08-04 ENCOUNTER — Emergency Department (HOSPITAL_BASED_OUTPATIENT_CLINIC_OR_DEPARTMENT_OTHER)
Admission: EM | Admit: 2016-08-04 | Discharge: 2016-08-04 | Disposition: A | Discharge status: Home / Self Care | Payer: Medicare Other | Attending: Emergency Medicine | Admitting: Emergency Medicine

## 2016-08-04 ENCOUNTER — Encounter (HOSPITAL_BASED_OUTPATIENT_CLINIC_OR_DEPARTMENT_OTHER): Payer: Self-pay | Admitting: *Deleted

## 2016-08-04 DIAGNOSIS — Z79899 Other long term (current) drug therapy: Secondary | ICD-10-CM | POA: Insufficient documentation

## 2016-08-04 DIAGNOSIS — R05 Cough: Secondary | ICD-10-CM | POA: Diagnosis present

## 2016-08-04 DIAGNOSIS — Z21 Asymptomatic human immunodeficiency virus [HIV] infection status: Secondary | ICD-10-CM | POA: Insufficient documentation

## 2016-08-04 DIAGNOSIS — J189 Pneumonia, unspecified organism: Secondary | ICD-10-CM | POA: Insufficient documentation

## 2016-08-04 LAB — BASIC METABOLIC PANEL
ANION GAP: 7 (ref 5–15)
BUN: 21 mg/dL — ABNORMAL HIGH (ref 6–20)
CO2: 24 mmol/L (ref 22–32)
Calcium: 9.7 mg/dL (ref 8.9–10.3)
Chloride: 104 mmol/L (ref 101–111)
Creatinine, Ser: 1.74 mg/dL — ABNORMAL HIGH (ref 0.61–1.24)
GFR calc Af Amer: 46 mL/min — ABNORMAL LOW (ref >60)
GFR, EST NON AFRICAN AMERICAN: 40 mL/min — AB (ref >60)
Glucose, Bld: 98 mg/dL (ref 65–99)
Potassium: 4 mmol/L (ref 3.5–5.1)
SODIUM: 135 mmol/L (ref 135–145)

## 2016-08-04 LAB — CBC WITH DIFFERENTIAL/PLATELET
BASOS ABS: 0 10*3/uL (ref 0.0–0.1)
Basophils Relative: 0 %
EOS PCT: 0 %
Eosinophils Absolute: 0 10*3/uL (ref 0.0–0.7)
HCT: 44.8 % (ref 39.0–52.0)
Hemoglobin: 15.8 g/dL (ref 13.0–17.0)
LYMPHS PCT: 14 %
Lymphs Abs: 1.4 10*3/uL (ref 0.7–4.0)
MCH: 36.2 pg — ABNORMAL HIGH (ref 26.0–34.0)
MCHC: 35.3 g/dL (ref 30.0–36.0)
MCV: 102.5 fL — AB (ref 78.0–100.0)
Monocytes Absolute: 0.8 10*3/uL (ref 0.1–1.0)
Monocytes Relative: 9 %
Neutro Abs: 7.6 10*3/uL (ref 1.7–7.7)
Neutrophils Relative %: 77 %
PLATELETS: 110 10*3/uL — AB (ref 150–400)
RBC: 4.37 MIL/uL (ref 4.22–5.81)
RDW: 13.4 % (ref 11.5–15.5)
WBC: 9.8 10*3/uL (ref 4.0–10.5)

## 2016-08-04 LAB — I-STAT CG4 LACTIC ACID, ED: LACTIC ACID, VENOUS: 1 mmol/L (ref 0.5–1.9)

## 2016-08-04 LAB — LACTATE DEHYDROGENASE: LDH: 179 U/L (ref 98–192)

## 2016-08-04 MED ORDER — LEVOFLOXACIN 750 MG PO TABS: 750.0000 mg | ORAL_TABLET | Freq: Every day | ORAL | 0 refills | Status: AC

## 2016-08-04 MED ORDER — SODIUM CHLORIDE 0.9 % IV BOLUS (SEPSIS)
1000.0000 mL | Freq: Once | INTRAVENOUS | Status: AC
  Administered 2016-08-04: 1000 mL via INTRAVENOUS

## 2016-08-04 MED ORDER — LEVOFLOXACIN 750 MG PO TABS
750.0000 mg | ORAL_TABLET | Freq: Once | ORAL | Status: AC
  Administered 2016-08-04: 750 mg via ORAL
  Filled 2016-08-04: qty 1

## 2016-08-04 MED FILL — levoFLOXacin 750 MG TABS: 750 | 5 days supply | Qty: 5 | Fill #0

## 2016-08-04 NOTE — Discharge Instructions (Signed)
Please take your antibiotics for management of her pneumonia. Please follow-up with your primary care physician in the next few days for further management. If any symptoms return or worsen, please come to the nearest emergency department for further management.

## 2016-08-04 NOTE — ED Notes (Signed)
Pt amb to [facility]

## 2016-08-04 NOTE — ED Provider Notes (Signed)
[facility] Provider Note   CSN: [id number] Arrival date & time: 08/04/16  1004     History   Chief Complaint Chief Complaint  Patient presents with  . Cough    HPI Joseph Humphrey is a 65 y.o. male With the past medical history of well-controlled HIV, Seizures, high cholesterol, and IBS who presents with cough. Patient reports that he has had cough for approximately one month that has slowly become worsened and productive of sputum. He denies chest pain or shortness of breath but is having some chills. He denies documented fevers. He denies nausea, vomiting, constipation, diarrhea, dysuria. He reports that he is closely followed by his hematology team at [facility] and he says his CD4 count and viral load are doing well. Patient denies any recent sick contacts. Patient denies any pain. Patient denies any other symptoms on arrival.    The history is provided by the patient and medical records. No language interpreter was used.  Cough  This is a new problem. The current episode started more than 1 week ago. The problem occurs constantly. The problem has not changed since onset.The cough is productive of sputum. There has been no fever. Associated symptoms include chills. Pertinent negatives include no chest pain, no sweats, no ear congestion, no headaches, no rhinorrhea, no sore throat, no shortness of breath and no wheezing. He has tried nothing for the symptoms. The treatment provided no relief. He is not a smoker. His past medical history is significant for pneumonia.    Past Medical History:  Diagnosis Date  . Depression   . High cholesterol   . HIV (human immunodeficiency virus infection) (HCC)   . IBS (irritable bowel syndrome)   . Paresthesias 03/05/2015   Left hand  . Seizures [facility](HCC)     Patient Active Problem List   Diagnosis Date Noted  . Paresthesias 03/05/2015    Past Surgical History:  Procedure Laterality Date  . JOINT REPLACEMENT    . KNEE  SURGERY     right  . ROTATOR CUFF REPAIR     right       Home Medications    Prior to Admission medications   Medication Sig Start Date End Date Taking? Authorizing Provider  atorvastatin (LIPITOR) 20 MG tablet Take 20 mg by mouth daily.   Yes Historical Provider, MD  diphenoxylate-atropine (LOMOTIL) 2.5-0.025 MG per tablet Take 1 tablet by mouth 4 (four) times daily as needed.      Historical Provider, MD  escitalopram (LEXAPRO) 10 MG tablet Take 10 mg by mouth daily.      Historical Provider, MD  fluticasone (FLONASE) 50 MCG/ACT nasal spray Place 2 sprays into both nostrils daily. For three days 09/09/13 09/12/13  Gerhard Munchobert Lockwood, MD  lamivudine (EPIVIR) 300 MG tablet Take 300 mg by mouth daily.      Historical Provider, MD  loperamide (IMODIUM A-D) 2 MG tablet Take 2 mg by mouth 4 (four) times daily as needed for diarrhea or loose stools.    Historical Provider, MD  lopinavir-ritonavir (KALETRA) 200-50 MG per tablet Take 2 tablets by mouth 2 (two) times daily.      Historical Provider, MD  phenytoin (DILANTIN) 100 MG ER capsule Take by mouth 3 (three) times daily.      Historical Provider, MD  saquinavir (INVIRASE) 500 MG tablet Take 1,000 mg by mouth 2 (two) times daily.    Historical Provider, MD    Family History History reviewed. No pertinent family history.  Social  History Social History  Substance Use Topics  . Smoking status: Never Smoker  . Smokeless tobacco: Never Used  . Alcohol use No     Allergies   Patient has no known allergies.   Review of Systems Review of Systems  Constitutional: Positive for chills. Negative for activity change, appetite change, diaphoresis, fatigue and fever.  HENT: Positive for congestion. Negative for rhinorrhea and sore throat.   Eyes: Negative for visual disturbance.  Respiratory: Positive for cough. Negative for chest tightness, shortness of breath, wheezing and stridor.   Cardiovascular: Negative for chest pain, palpitations and  leg swelling.  Gastrointestinal: Negative for abdominal distention, abdominal pain, blood in stool, constipation, diarrhea, nausea and vomiting.  Genitourinary: Negative for difficulty urinating, dysuria and flank pain.  Musculoskeletal: Negative for back pain and gait problem.  Skin: Negative for rash and wound.  Neurological: Negative for dizziness, weakness, light-headedness, numbness and headaches.  Psychiatric/Behavioral: Negative for agitation.  All other systems reviewed and are negative.    Physical Exam Updated Vital Signs BP 120/77 (BP Location: Right Arm)   Pulse 86   Temp 98.5 F (36.9 C) (Oral)   Resp 18   SpO2 96%   Physical Exam  Constitutional: He appears well-developed and well-nourished.  HENT:  Head: Normocephalic and atraumatic.  Right Ear: External ear normal.  Left Ear: External ear normal.  Nose: Rhinorrhea present.  Mouth/Throat: No oropharyngeal exudate.  Eyes: Conjunctivae are normal.  Neck: Neck supple.  Cardiovascular: Normal rate and regular rhythm.   No murmur heard. Pulmonary/Chest: Effort normal. No tachypnea. No respiratory distress. He has rhonchi.  Abdominal: Soft. Normal appearance. There is no tenderness.  Musculoskeletal: He exhibits no edema.  Neurological: He is alert. He is not disoriented. He displays no tremor. No cranial nerve deficit or sensory deficit. Coordination and gait normal. GCS eye subscore is 4. GCS verbal subscore is 5. GCS motor subscore is 6.  Skin: Skin is warm and dry.  Psychiatric: He has a normal mood and affect.  Nursing note and vitals reviewed.    ED Treatments / Results  Labs (all labs ordered are listed, but only abnormal results are displayed) Labs Reviewed  CBC WITH DIFFERENTIAL/PLATELET - Abnormal; Notable for the following:       Result Value   MCV 102.5 (*)    MCH 36.2 (*)    Platelets 110 (*)    All other components within normal limits  BASIC METABOLIC PANEL - Abnormal; Notable for the  following:    BUN 21 (*)    Creatinine, Ser 1.74 (*)    GFR calc non Af Amer 40 (*)    GFR calc Af Amer 46 (*)    All other components within normal limits  LACTATE DEHYDROGENASE  I-STAT CG4 LACTIC ACID, ED    EKG  EKG Interpretation None       Radiology Dg Chest 2 View  Result Date: 08/04/2016 CLINICAL DATA:  Cough for 1 month, low-grade fever, congestion EXAM: CHEST  2 VIEW COMPARISON:  Chest x-ray of 09/09/2013 FINDINGS: There are slightly prominent markings at a lung base in the region of the anterior basal segment, seen only on the lateral view. A developing pneumonia cannot be excluded. No effusion is seen. Mediastinal and hilar contours are unremarkable. The heart is within normal limits in size. No bony abnormality is seen. IMPRESSION: Slightly prominent markings anteriorly on the lateral view could indicate a developing pneumonia in the region of an anterior basal segment of a lower lobe,  possibly on the right. Consider followup chest x-ray if warranted clinically. Electronically Signed   By: Dwyane Dee M.D.   On: 08/04/2016 11:26    Procedures Procedures (including critical care time)  Medications Ordered in ED Medications  sodium chloride 0.9 % bolus 1,000 mL (0 mLs Intravenous Stopped 08/04/16 1223)  levofloxacin (LEVAQUIN) tablet 750 mg (750 mg Oral Given 08/04/16 1408)     Initial Impression / Assessment and Plan / ED Course  I have reviewed the triage vital signs and the nursing notes.  Pertinent labs & imaging results that were available during my care of the patient were reviewed by me and considered in my medical decision making (see chart for details).  Clinical Course as of Aug 04 2201  Tue Aug 04, 2016  2201 LDH: 179 [CT]  2202 LDH: 179 [CT]    Clinical Course User Index [CT] Canary Brim Mansoor Hillyard, MD   Joseph Humphrey is a 65 y.o. male With the past medical history of well-controlled HIV, Seizures, high cholesterol, and IBS who presents with  cough.  History and exam are seen above.  On exam, patient has course breath sounds in his chest. Rhinorrhea visible. Audible congestion heard. No chest tenderness. No abdominal tenderness. No neurologic deficits.  Given patient symptoms, suspect pneumonia. Chest x-ray shows concern for pneumonia. Screening laboratory testing performed to look for systemic or severe infection. Laboratory testing results are seen above.  Normal lactic acid, No leukocytosis, kidney function similar to prior. LDH nonelevated which might be associated with a PCP pneumonia.  Given patients reassuring labs and reassuring vital signs, patient felt appropriate for outpatient management.  Patient given dose of levofloxacin in the ED.Shared decision-making held the patient about admission versus discharged. Given patients well appearance, and normal vital signs, do not feel patient requires admission. Patient given extremely strict return precautions for any worsening symptoms as admission would likely be necessary at that time. Patient given prescription for levofloxacin and instructions to follow up with PCP in several days. Patient understood plan had no other questions or concerns. Patient discharged in good condition.    Final Clinical Impressions(s) / ED Diagnoses   Final diagnoses:  Community acquired pneumonia, unspecified laterality    New Prescriptions Discharge Medication List as of 08/04/2016  1:55 PM    START taking these medications   Details  levofloxacin (LEVAQUIN) 750 MG tablet Take 1 tablet (750 mg total) by mouth daily., Starting Tue 08/04/2016, Until Sun 08/09/2016, Print        Clinical Impression: 1. Community acquired pneumonia, unspecified laterality     Disposition: Discharge  Condition: Good  I have discussed the results, Dx and Tx plan with the pt(& family if present). He/she/they expressed understanding and agree(s) with the plan. Discharge instructions discussed at great  length. Strict return precautions discussed and pt &/or family have verbalized understanding of the instructions. No further questions at time of discharge.    Discharge Medication List as of 08/04/2016  1:55 PM    START taking these medications   Details  levofloxacin (LEVAQUIN) 750 MG tablet Take 1 tablet (750 mg total) by mouth daily., Starting Tue 08/04/2016, Until Sun 08/09/2016, Print        Follow Up: Martyn Malay, MD [address] [place] [address] [phone]  Schedule an appointment as soon as possible for a visit    [facility] [place] [facility]GENCY DEPARTMENT [address] [phone] [facility] [address] [place] [address] [phone]  If symptoms worsen  Canary Brim Odin Mariani, MD 08/04/16 2203

## 2016-08-04 NOTE — ED Triage Notes (Signed)
Pt reports cough for over one month, worse and with low grade temps x last night. Also nasal congestion, some relief with otc nasal sprays.

## 2016-08-25 ENCOUNTER — Emergency Department (HOSPITAL_BASED_OUTPATIENT_CLINIC_OR_DEPARTMENT_OTHER)
Admission: EM | Admit: 2016-08-25 | Discharge: 2016-08-25 | Disposition: A | Discharge status: Home / Self Care | Payer: Medicare Other | Attending: Emergency Medicine | Admitting: Emergency Medicine

## 2016-08-25 ENCOUNTER — Emergency Department (HOSPITAL_BASED_OUTPATIENT_CLINIC_OR_DEPARTMENT_OTHER): Payer: Medicare Other

## 2016-08-25 ENCOUNTER — Encounter (HOSPITAL_BASED_OUTPATIENT_CLINIC_OR_DEPARTMENT_OTHER): Payer: Self-pay | Admitting: Emergency Medicine

## 2016-08-25 DIAGNOSIS — Z87891 Personal history of nicotine dependence: Secondary | ICD-10-CM | POA: Insufficient documentation

## 2016-08-25 DIAGNOSIS — Z21 Asymptomatic human immunodeficiency virus [HIV] infection status: Secondary | ICD-10-CM | POA: Diagnosis not present

## 2016-08-25 DIAGNOSIS — R509 Fever, unspecified: Secondary | ICD-10-CM | POA: Insufficient documentation

## 2016-08-25 DIAGNOSIS — E119 Type 2 diabetes mellitus without complications: Secondary | ICD-10-CM | POA: Insufficient documentation

## 2016-08-25 DIAGNOSIS — R05 Cough: Secondary | ICD-10-CM | POA: Insufficient documentation

## 2016-08-25 DIAGNOSIS — Z79899 Other long term (current) drug therapy: Secondary | ICD-10-CM | POA: Diagnosis not present

## 2016-08-25 DIAGNOSIS — R0781 Pleurodynia: Secondary | ICD-10-CM | POA: Diagnosis not present

## 2016-08-25 DIAGNOSIS — R0981 Nasal congestion: Secondary | ICD-10-CM | POA: Insufficient documentation

## 2016-08-25 DIAGNOSIS — J111 Influenza due to unidentified influenza virus with other respiratory manifestations: Secondary | ICD-10-CM

## 2016-08-25 DIAGNOSIS — R69 Illness, unspecified: Secondary | ICD-10-CM

## 2016-08-25 LAB — CBC WITH DIFFERENTIAL/PLATELET
BASOS PCT: 0 %
Basophils Absolute: 0 10*3/uL (ref 0.0–0.1)
EOS PCT: 1 %
Eosinophils Absolute: 0 10*3/uL (ref 0.0–0.7)
HEMATOCRIT: 40.6 % (ref 39.0–52.0)
HEMOGLOBIN: 14.7 g/dL (ref 13.0–17.0)
Lymphocytes Relative: 17 %
Lymphs Abs: 0.6 10*3/uL — ABNORMAL LOW (ref 0.7–4.0)
MCH: 36.4 pg — AB (ref 26.0–34.0)
MCHC: 36.2 g/dL — ABNORMAL HIGH (ref 30.0–36.0)
MCV: 100.5 fL — AB (ref 78.0–100.0)
MONOS PCT: 13 %
Monocytes Absolute: 0.4 10*3/uL (ref 0.1–1.0)
NEUTROS PCT: 69 %
Neutro Abs: 2.3 10*3/uL (ref 1.7–7.7)
Platelets: 108 10*3/uL — ABNORMAL LOW (ref 150–400)
RBC: 4.04 MIL/uL — AB (ref 4.22–5.81)
RDW: 12.7 % (ref 11.5–15.5)
WBC: 3.3 10*3/uL — ABNORMAL LOW (ref 4.0–10.5)

## 2016-08-25 LAB — BASIC METABOLIC PANEL
ANION GAP: 6 (ref 5–15)
BUN: 18 mg/dL (ref 6–20)
CHLORIDE: 105 mmol/L (ref 101–111)
CO2: 23 mmol/L (ref 22–32)
CREATININE: 1.68 mg/dL — AB (ref 0.61–1.24)
Calcium: 9.5 mg/dL (ref 8.9–10.3)
GFR calc non Af Amer: 41 mL/min — ABNORMAL LOW (ref >60)
GFR, EST AFRICAN AMERICAN: 48 mL/min — AB (ref >60)
Glucose, Bld: 110 mg/dL — ABNORMAL HIGH (ref 65–99)
POTASSIUM: 3.9 mmol/L (ref 3.5–5.1)
Sodium: 134 mmol/L — ABNORMAL LOW (ref 135–145)

## 2016-08-25 LAB — I-STAT CG4 LACTIC ACID, ED: Lactic Acid, Venous: 1.92 mmol/L (ref 0.5–1.9)

## 2016-08-25 MED ORDER — OSELTAMIVIR PHOSPHATE 75 MG PO CAPS: 75.0000 mg | ORAL_CAPSULE | Freq: Two times a day (BID) | ORAL | 0 refills | Status: DC

## 2016-08-25 MED ORDER — BENZONATATE 100 MG PO CAPS: 100.0000 mg | ORAL_CAPSULE | Freq: Three times a day (TID) | ORAL | 0 refills | Status: DC

## 2016-08-25 MED FILL — BENZONATATE 100 MG CAPSULE: 100 | 7 days supply | Qty: 21 | Fill #0

## 2016-08-25 MED FILL — OSELTAMIVIR PHOS 75 MG CAP: 75 | 5 days supply | Qty: 10 | Fill #0

## 2016-08-25 NOTE — ED Triage Notes (Signed)
Pt treated for Pneumonia 2 weeks ago.  Sx had resolved.  Two days ago started coughing again with chest congestion and head congestion.

## 2016-08-25 NOTE — ED Provider Notes (Signed)
[facility] Provider Note   CSN: [id number] Arrival date & time: 08/25/16  4098     History   Chief Complaint No chief complaint on file.   HPI Joseph Humphrey is a 66 y.o. male.  HPI   66 year old male with history of well-controlled HIV, depression, HLD, seizures presenting with flulike symptoms. Patient was seen in the ED on 08/04/16 with cough. Chest x-ray at that time demonstrated evidence of pneumonia. He was well-appearing and labs were reassuring therefore he was discharged with Levaquin. Patient took his medication for the full duration and his symptoms resolved. Since yesterday he now developed nasal congestion, cough productive with yellow sputum, low-grade fever, chills, and not feeling well. Endorsed pleuritic chest pain only with cough. Denies any severe headache, ear pain, sore throat, shortness of breath, abdominal pain, nausea vomiting diarrhea, or rash. He reports that his coworker who sits across from him recently had a cold. He denies any recent travel. States that his last CD4 count was greater than 400, and viral load is undetectable. Denies any specific treatment tried at home.  Past Medical History:  Diagnosis Date  . Depression   . High cholesterol   . HIV (human immunodeficiency virus infection) (HCC)   . IBS (irritable bowel syndrome)   . Paresthesias 03/05/2015   Left hand  . Seizures [facility])     Patient Active Problem List   Diagnosis Date Noted  . Paresthesias 03/05/2015    Past Surgical History:  Procedure Laterality Date  . JOINT REPLACEMENT    . KNEE SURGERY     right  . ROTATOR CUFF REPAIR     right       Home Medications    Prior to Admission medications   Medication Sig Start Date End Date Taking? Authorizing Provider  atorvastatin (LIPITOR) 20 MG tablet Take 20 mg by mouth daily.    Historical Provider, MD  diphenoxylate-atropine (LOMOTIL) 2.5-0.025 MG per tablet Take 1 tablet by mouth 4 (four) times daily as needed.       Historical Provider, MD  escitalopram (LEXAPRO) 10 MG tablet Take 10 mg by mouth daily.      Historical Provider, MD  fluticasone (FLONASE) 50 MCG/ACT nasal spray Place 2 sprays into both nostrils daily. For three days 09/09/13 09/12/13  Gerhard Munch, MD  lamivudine (EPIVIR) 300 MG tablet Take 300 mg by mouth daily.      Historical Provider, MD  loperamide (IMODIUM A-D) 2 MG tablet Take 2 mg by mouth 4 (four) times daily as needed for diarrhea or loose stools.    Historical Provider, MD  lopinavir-ritonavir (KALETRA) 200-50 MG per tablet Take 2 tablets by mouth 2 (two) times daily.      Historical Provider, MD  phenytoin (DILANTIN) 100 MG ER capsule Take by mouth 3 (three) times daily.      Historical Provider, MD  saquinavir (INVIRASE) 500 MG tablet Take 1,000 mg by mouth 2 (two) times daily.    Historical Provider, MD    Family History No family history on file.  Social History Social History  Substance Use Topics  . Smoking status: Never Smoker  . Smokeless tobacco: Never Used  . Alcohol use No     Allergies   Patient has no known allergies.   Review of Systems Review of Systems  All other systems reviewed and are negative.    Physical Exam Updated Vital Signs BP 98/70 (BP Location: Left Arm)   Pulse 96   Temp 98.1 F (  36.7 C)   Resp 18   Ht 5\' 7"  (1.702 m)   Wt 71.2 kg   SpO2 96%   BMI 24.59 kg/m   Physical Exam  Constitutional: He appears well-developed and well-nourished. No distress.  HENT:  Head: Atraumatic.  Right Ear: External ear normal.  Left Ear: External ear normal.  Mouth/Throat: Oropharynx is clear and moist.  Eyes: Conjunctivae are normal.  Neck: Normal range of motion. Neck supple.  No nuchal rigidity  Cardiovascular: Normal rate, regular rhythm and intact distal pulses.   Pulmonary/Chest: Effort normal and breath sounds normal. No respiratory distress. He has no wheezes. He has no rales. He exhibits no tenderness.  Abdominal: Soft.  There is no tenderness.  Musculoskeletal: He exhibits no edema.  Lymphadenopathy:    He has no cervical adenopathy.  Neurological: He is alert.  Skin: No rash noted.  Psychiatric: He has a normal mood and affect.  Nursing note and vitals reviewed.    ED Treatments / Results  Labs (all labs ordered are listed, but only abnormal results are displayed) Labs Reviewed  BASIC METABOLIC PANEL - Abnormal; Notable for the following:       Result Value   Sodium 134 (*)    Glucose, Bld 110 (*)    Creatinine, Ser 1.68 (*)    GFR calc non Af Amer 41 (*)    GFR calc Af Amer 48 (*)    All other components within normal limits  CBC WITH DIFFERENTIAL/PLATELET - Abnormal; Notable for the following:    WBC 3.3 (*)    RBC 4.04 (*)    MCV 100.5 (*)    MCH 36.4 (*)    MCHC 36.2 (*)    Platelets 108 (*)    Lymphs Abs 0.6 (*)    All other components within normal limits  I-STAT CG4 LACTIC ACID, ED - Abnormal; Notable for the following:    Lactic Acid, Venous 1.92 (*)    All other components within normal limits    EKG  EKG Interpretation None       Radiology Dg Chest 2 View  Result Date: 08/25/2016 CLINICAL DATA:  Onset of cough and chest congestion and mild fever 2 days ago. Episode of pneumonia 2 weeks ago with interval improvement. EXAM: CHEST  2 VIEW COMPARISON:  PA and lateral chest x-ray of August 04, 2016. FINDINGS: The lungs are reasonably well inflated. There is no focal infiltrate. Density noted on the previous study projecting over the anterior aspects of a lower lobe has improved. The heart and pulmonary vascularity are normal. The mediastinum is normal in width. There is no pleural effusion. The bony thorax exhibits no acute abnormality. IMPRESSION: No pneumonia nor other acute cardiopulmonary abnormality. Electronically Signed   By: David  [place]azil[place] M.D.   On: 08/25/2016 10:26    Procedures Procedures (including critical care time)  Medications Ordered in ED Medications - No  data to display   Initial Impression / Assessment and Plan / ED Course  I have reviewed the triage vital signs and the nursing notes.  Pertinent labs & imaging results that were available during my care of the patient were reviewed by me and considered in my medical decision making (see chart for details).  Clinical Course     BP 98/70 (BP Location: Left Arm)   Pulse 96   Temp 98.1 F (36.7 C)   Resp 18   Ht 5\' 7"  (1.702 m)   Wt 71.2 kg   SpO2 96%  BMI 24.59 kg/m    Final Clinical Impressions(s) / ED Diagnoses   Final diagnoses:  Influenza-like illness    New Prescriptions New Prescriptions   BENZONATATE (TESSALON) 100 MG CAPSULE    Take 1 capsule (100 mg total) by mouth every 8 (eight) hours.   OSELTAMIVIR (TAMIFLU) 75 MG CAPSULE    Take 1 capsule (75 mg total) by mouth every 12 (twelve) hours.   10:11 AM Patient with history of well-controlled diabetes, recent community acquired pneumonia 2 weeks ago that was treated with Levaquin, presenting with URI symptoms. He is currently afebrile, however blood pressure is soft as 98/70. He is well-appearing, exam unremarkable. Will obtain basic labs, lactic acid, and a chest x-ray.  11:44 AM Patient does have a mildly elevated lactic acid of 1.92. Evidence of renal insufficiency with a creatinine of 1.68 however improves from prior. His chest x-ray showed no signs of pneumonia. Given history of HIV, and early onset of flulike symptoms, patient may benefit from antiviral medication such as Tamiflu.  Pt agrees with plan.  Return precaution given.    Fayrene Helper, PA-C 08/25/16 1154    Rolan Bucco, MD 08/25/16 1459

## 2016-08-25 NOTE — ED Notes (Signed)
ED Provider at bedside. 

## 2017-03-21 ENCOUNTER — Emergency Department (HOSPITAL_BASED_OUTPATIENT_CLINIC_OR_DEPARTMENT_OTHER): Payer: Medicare Other

## 2017-03-21 ENCOUNTER — Emergency Department (HOSPITAL_BASED_OUTPATIENT_CLINIC_OR_DEPARTMENT_OTHER)
Admission: EM | Admit: 2017-03-21 | Discharge: 2017-03-21 | Disposition: A | Discharge status: Home / Self Care | Payer: Medicare Other | Attending: Emergency Medicine | Admitting: Emergency Medicine

## 2017-03-21 ENCOUNTER — Encounter (HOSPITAL_BASED_OUTPATIENT_CLINIC_OR_DEPARTMENT_OTHER): Payer: Self-pay | Admitting: Emergency Medicine

## 2017-03-21 DIAGNOSIS — Z79899 Other long term (current) drug therapy: Secondary | ICD-10-CM | POA: Insufficient documentation

## 2017-03-21 DIAGNOSIS — J40 Bronchitis, not specified as acute or chronic: Secondary | ICD-10-CM

## 2017-03-21 DIAGNOSIS — Z87891 Personal history of nicotine dependence: Secondary | ICD-10-CM | POA: Insufficient documentation

## 2017-03-21 DIAGNOSIS — R05 Cough: Secondary | ICD-10-CM

## 2017-03-21 DIAGNOSIS — R058 Other specified cough: Secondary | ICD-10-CM

## 2017-03-21 DIAGNOSIS — Z96651 Presence of right artificial knee joint: Secondary | ICD-10-CM | POA: Diagnosis not present

## 2017-03-21 DIAGNOSIS — R059 Cough, unspecified: Secondary | ICD-10-CM

## 2017-03-21 DIAGNOSIS — J209 Acute bronchitis, unspecified: Secondary | ICD-10-CM | POA: Diagnosis not present

## 2017-03-21 MED ORDER — AZITHROMYCIN 250 MG PO TABS: ORAL_TABLET | ORAL | 0 refills | Status: DC

## 2017-03-21 NOTE — ED Provider Notes (Signed)
[facility] Provider Note   CSN: [id number] Arrival date & time: 03/21/17  1259     History   Chief Complaint Chief Complaint  Patient presents with  . Cough    HPI Joseph Humphrey is a 66 y.o. male.  Patient c/o productive cough, yellowish/gold phlegm, chest congestion, for the past week. Cough episodic, persistent, getting worse. Notes hx pna several times in past. Former smoker. Denies sore throat, or runny nose. subj fever. No chest pain or exertional cp or dyspnea. No headache. No abd pain, no nv.     The history is provided by the patient.  Cough  Pertinent negatives include no chest pain, no chills, no headaches, no rhinorrhea, no sore throat, no shortness of breath and no eye redness.    Past Medical History:  Diagnosis Date  . Depression   . High cholesterol   . HIV (human immunodeficiency virus infection) (HCC)   . IBS (irritable bowel syndrome)   . Paresthesias 03/05/2015   Left hand  . Seizures [facility](HCC)     Patient Active Problem List   Diagnosis Date Noted  . Paresthesias 03/05/2015    Past Surgical History:  Procedure Laterality Date  . JOINT REPLACEMENT    . KNEE SURGERY     right  . ROTATOR CUFF REPAIR     right       Home Medications    Prior to Admission medications   Medication Sig Start Date End Date Taking? Authorizing Provider  gabapentin (NEURONTIN) 300 MG capsule Take 300 mg by mouth 3 (three) times daily.   Yes [provider]  atorvastatin (LIPITOR) 20 MG tablet Take 20 mg by mouth daily.    [provider]  benzonatate (TESSALON) 100 MG capsule Take 1 capsule (100 mg total) by mouth every 8 (eight) hours. 08/25/16   Fayrene Helperran, Bowie, PA-C  diphenoxylate-atropine (LOMOTIL) 2.5-0.025 MG per tablet Take 1 tablet by mouth 4 (four) times daily as needed.      [provider]  escitalopram (LEXAPRO) 10 MG tablet Take 10 mg by mouth daily.      [provider]  finasteride (PROSCAR) 5 MG tablet  Take 5 mg by mouth daily.    [provider]  fluticasone (FLONASE) 50 MCG/ACT nasal spray Place 2 sprays into both nostrils daily. For three days 09/09/13 09/12/13  Gerhard MunchLockwood, Robert, MD  lamivudine (EPIVIR) 300 MG tablet Take 300 mg by mouth daily.      [provider]  loperamide (IMODIUM A-D) 2 MG tablet Take 2 mg by mouth 4 (four) times daily as needed for diarrhea or loose stools.    [provider]  lopinavir-ritonavir (KALETRA) 200-50 MG per tablet Take 2 tablets by mouth 2 (two) times daily.      [provider]  oseltamivir (TAMIFLU) 75 MG capsule Take 1 capsule (75 mg total) by mouth every 12 (twelve) hours. 08/25/16   Fayrene Helperran, Bowie, PA-C  oxybutynin (DITROPAN XL) 15 MG 24 hr tablet Take 15 mg by mouth daily after breakfast.    [provider]  phenytoin (DILANTIN) 100 MG ER capsule Take by mouth 3 (three) times daily.      [provider]  saquinavir (INVIRASE) 500 MG tablet Take 1,000 mg by mouth 2 (two) times daily.    [provider]    Family History No family history on file.  Social History Social History  Substance Use Topics  . Smoking status: Former Games developermoker  . Smokeless tobacco: Never  Used  . Alcohol use Yes     Comment: occ     Allergies   Patient has no known allergies.   Review of Systems Review of Systems  Constitutional: Negative for chills.  HENT: Negative for rhinorrhea and sore throat.   Eyes: Negative for redness.  Respiratory: Positive for cough. Negative for shortness of breath.   Cardiovascular: Negative for chest pain and leg swelling.  Gastrointestinal: Negative for abdominal pain.  Genitourinary: Negative for flank pain.  Musculoskeletal: Negative for back pain.  Skin: Negative for rash.  Neurological: Negative for headaches.  Hematological: Does not bruise/bleed easily.  Psychiatric/Behavioral: Negative for confusion.     Physical Exam Updated Vital Signs BP 122/73 (BP Location:  Right Arm)   Pulse 80   Temp 98.3 F (36.8 C) (Oral)   Resp 18   Ht 1.702 m (5\' 7" )   Wt 72.6 kg (160 lb)   SpO2 96%   BMI 25.06 kg/m   Physical Exam  Constitutional: He appears well-developed and well-nourished. No distress.  HENT:  Mouth/Throat: Oropharynx is clear and moist.  Eyes: Conjunctivae are normal.  Neck: Neck supple. No tracheal deviation present.  Cardiovascular: Normal rate, regular rhythm, normal heart sounds and intact distal pulses.  Exam reveals no gallop and no friction rub.   No murmur heard. Pulmonary/Chest: Effort normal. No accessory muscle usage. No respiratory distress.  Coughing, chest congestion/few rhonchi  Abdominal: Soft. He exhibits no distension. There is no tenderness.  Musculoskeletal: He exhibits no edema.  Neurological: He is alert.  Skin: Skin is warm and dry. No rash noted. He is not diaphoretic.  Psychiatric: He has a normal mood and affect.  Nursing note and vitals reviewed.    ED Treatments / Results  Labs (all labs ordered are listed, but only abnormal results are displayed) Labs Reviewed - No data to display  EKG  EKG Interpretation None       Radiology Dg Chest 2 View  Result Date: 03/21/2017 CLINICAL DATA:  Cough and congestion for 1 week, worsening. EXAM: CHEST  2 VIEW COMPARISON:  08/25/2016 FINDINGS: The cardiomediastinal silhouette is within normal limits. The patient has taken a slightly shallower inspiration than on the prior study. No confluent airspace opacity, edema, pleural effusion, or pneumothorax is identified. Suspected nipple shadow in the left lung base, unchanged. No acute osseous abnormality is seen. IMPRESSION: No active cardiopulmonary disease. Electronically Signed   By: Sebastian AcheAllen  Grady M.D.   On: 03/21/2017 13:24    Procedures Procedures (including critical care time)  Medications Ordered in ED Medications - No data to display   Initial Impression / Assessment and Plan / ED Course  I have reviewed  the triage vital signs and the nursing notes.  Pertinent labs & imaging results that were available during my care of the patient were reviewed by me and considered in my medical decision making (see chart for details).  Sounds congestion, hx pna, states worried is same.   Exam c/w bronchitis vs early pna.  Will rx.   Confirmed nkda.     Final Clinical Impressions(s) / ED Diagnoses   Final diagnoses:  None    New Prescriptions New Prescriptions   No medications on file     Cathren LaineSteinl, Lokelani Lutes, MD 03/21/17 1416

## 2017-03-21 NOTE — ED Triage Notes (Signed)
Cough x 1 week, SOB with exertion.

## 2017-03-21 NOTE — Discharge Instructions (Signed)
It was our pleasure to provide your ER care today - we hope that you feel better.  Rest. Drink adequate fluids.  Take zithromax as prescribed.  Try mucinex or robitussin as need for cough.   Follow up with primary care doctor in 1 week if symptoms fail to improve/resolve.

## 2017-12-19 ENCOUNTER — Other Ambulatory Visit: Payer: Self-pay

## 2017-12-19 ENCOUNTER — Emergency Department (HOSPITAL_BASED_OUTPATIENT_CLINIC_OR_DEPARTMENT_OTHER)
Admission: EM | Admit: 2017-12-19 | Discharge: 2017-12-19 | Disposition: A | Discharge status: Home / Self Care | Payer: Medicare Other | Attending: Emergency Medicine | Admitting: Emergency Medicine

## 2017-12-19 ENCOUNTER — Encounter (HOSPITAL_BASED_OUTPATIENT_CLINIC_OR_DEPARTMENT_OTHER): Payer: Self-pay | Admitting: Emergency Medicine

## 2017-12-19 DIAGNOSIS — B20 Human immunodeficiency virus [HIV] disease: Secondary | ICD-10-CM | POA: Insufficient documentation

## 2017-12-19 DIAGNOSIS — Z79899 Other long term (current) drug therapy: Secondary | ICD-10-CM | POA: Insufficient documentation

## 2017-12-19 DIAGNOSIS — Z87891 Personal history of nicotine dependence: Secondary | ICD-10-CM | POA: Insufficient documentation

## 2017-12-19 DIAGNOSIS — Z4800 Encounter for change or removal of nonsurgical wound dressing: Secondary | ICD-10-CM | POA: Diagnosis present

## 2017-12-19 DIAGNOSIS — Z4801 Encounter for change or removal of surgical wound dressing: Secondary | ICD-10-CM | POA: Diagnosis not present

## 2017-12-19 NOTE — ED Triage Notes (Signed)
Patient states that he had surgery to his right wrist on Friday. His dressing needs to be redressed because this one is "drity and falling apart"

## 2017-12-19 NOTE — ED Notes (Signed)
Dressing changed by this RN. Pt instructed.

## 2017-12-19 NOTE — ED Notes (Signed)
Pt given d/c instructions as per chart. Verbalizes understanding. No questions. 

## 2017-12-19 NOTE — ED Provider Notes (Signed)
[facility] EMERGENCY DEPARTMENT Provider Note   CSN: [id number] Arrival date & time: 12/19/17  1853     History   Chief Complaint Chief Complaint  Patient presents with  . Wound Check    HPI Joseph Humphrey is a 66 y.o. male with a history of HIV and carpal tunnel s/p carpal tunnel release presents to the emergency department with a chief complaint of dressing change to the right wrist.  The patient had a carpal tunnel release performed on December 17, 2017 by Dr. Kelby Aline.  He is scheduled to follow-up in 5 days.  He reports that the dressing that was placed on the surgical site has become soiled and tattered.   He denies fever, chills, erythema, edema, or swelling around the incision site, numbness, weakness.  He reports that his pain has been minimal and he has not required any pain medication that was prescribed by his surgeon.  He has been treating his pain with ibuprofen and Tylenol.  He has no other complaints at this time.  The history is provided by the patient. No language interpreter was used.  Wound Check  Pertinent negatives include no chest pain, no abdominal pain and no shortness of breath.    Past Medical History:  Diagnosis Date  . Depression   . High cholesterol   . HIV (human immunodeficiency virus infection) (HCC)   . IBS (irritable bowel syndrome)   . Paresthesias 03/05/2015   Left hand  . Seizures [facility])     Patient Active Problem List   Diagnosis Date Noted  . Paresthesias 03/05/2015    Past Surgical History:  Procedure Laterality Date  . JOINT REPLACEMENT    . KNEE SURGERY     right  . ROTATOR CUFF REPAIR     right        Home Medications    Prior to Admission medications   Medication Sig Start Date End Date Taking? Authorizing Provider  Dolutegravir-Rilpivirine (JULUCA) 50-25 MG TABS Take by mouth.   Yes [provider]  atorvastatin (LIPITOR) 20 MG tablet Take 20 mg by mouth daily.    [provider]    azithromycin (ZITHROMAX Z-PAK) 250 MG tablet Take as directed 03/21/17   Cathren Laine, MD  benzonatate (TESSALON) 100 MG capsule Take 1 capsule (100 mg total) by mouth every 8 (eight) hours. 08/25/16   Fayrene Helper, PA-C  diphenoxylate-atropine (LOMOTIL) 2.5-0.025 MG per tablet Take 1 tablet by mouth 4 (four) times daily as needed.      [provider]  escitalopram (LEXAPRO) 10 MG tablet Take 10 mg by mouth daily.      [provider]  finasteride (PROSCAR) 5 MG tablet Take 5 mg by mouth daily.    [provider]  fluticasone (FLONASE) 50 MCG/ACT nasal spray Place 2 sprays into both nostrils daily. For three days 09/09/13 09/12/13  Gerhard Munch, MD  gabapentin (NEURONTIN) 300 MG capsule Take 300 mg by mouth 3 (three) times daily.    [provider]  lamivudine (EPIVIR) 300 MG tablet Take 300 mg by mouth daily.      [provider]  loperamide (IMODIUM A-D) 2 MG tablet Take 2 mg by mouth 4 (four) times daily as needed for diarrhea or loose stools.    [provider]  lopinavir-ritonavir (KALETRA) 200-50 MG per tablet Take 2 tablets by mouth 2 (two) times daily.      [provider]  oseltamivir (TAMIFLU) 75 MG capsule Take 1 capsule (75  mg total) by mouth every 12 (twelve) hours. 08/25/16   Fayrene Helper, PA-C  oxybutynin (DITROPAN XL) 15 MG 24 hr tablet Take 15 mg by mouth daily after breakfast.    [provider]  phenytoin (DILANTIN) 100 MG ER capsule Take by mouth 3 (three) times daily.      [provider]  saquinavir (INVIRASE) 500 MG tablet Take 1,000 mg by mouth 2 (two) times daily.    [provider]    Family History History reviewed. No pertinent family history.  Social History Social History   Tobacco Use  . Smoking status: Former Games developer  . Smokeless tobacco: Never Used  Substance Use Topics  . Alcohol use: Yes    Comment: occ  . Drug use: No     Allergies   Patient has no known  allergies.   Review of Systems Review of Systems  Constitutional: Negative for activity change.  Respiratory: Negative for shortness of breath.   Cardiovascular: Negative for chest pain.  Gastrointestinal: Negative for abdominal pain.  Musculoskeletal: Negative for back pain.  Skin: Positive for wound. Negative for color change and rash.  Neurological: Negative for weakness and numbness.   Physical Exam Updated Vital Signs BP 136/75 (BP Location: Left Arm)   Pulse 70   Temp 98.4 F (36.9 C) (Oral)   Resp 20   Ht  (1.727 m)   Wt 72.6 kg (160 lb)   SpO2 95%   BMI 24.33 kg/m   Physical Exam  Constitutional: He appears well-developed.  HENT:  Head: Normocephalic.  Eyes: Conjunctivae are normal.  Neck: Neck supple.  Cardiovascular: Normal rate and regular rhythm.  No murmur heard. Pulmonary/Chest: Effort normal.  Abdominal: Soft. He exhibits no distension.  Neurological: He is alert.  Skin: Skin is warm and dry.  Sutures in place on the volar aspect of the right wrist.  Incision site appears well-healing. No surrounding erythema, edema, or warmth.   Psychiatric: His behavior is normal.  Nursing note and vitals reviewed.  ED Treatments / Results  Labs (all labs ordered are listed, but only abnormal results are displayed) Labs Reviewed - No data to display  EKG None  Radiology No results found.  Procedures Procedures (including critical care time)  Medications Ordered in ED Medications - No data to display   Initial Impression / Assessment and Plan / ED Course  I have reviewed the triage vital signs and the nursing notes.  Pertinent labs & imaging results that were available during my care of the patient were reviewed by me and considered in my medical decision making (see chart for details).     67 year old male with a history of HIV and carpal tunnel s/p carpal tunnel release presents to the emergency department with a chief complaint of dressing  change to the right wrist. The patient was seen and evaluated by Dr. Ethelda Chick, attending physician. On exam, there is a well-healing surgical scar to the volar surface of the right wrist. The area was cleaned and dressing was replaced. Encouraged patient to f/u with his surgeon and follow all d/c instructions given by his surgeon. Strict return precautions given. NAD. He is safe for d/c with OP follow up at this time.   Final Clinical Impressions(s) / ED Diagnoses   Final diagnoses:  Dressing change or removal, surgical wound    ED Discharge Orders    None       Zaki Gertsch A, PA-C 12/20/17 1043    Doug Sou, MD  12/20/17 1224  

## 2017-12-19 NOTE — Discharge Instructions (Addendum)
Keep your follow-up appointment on Friday with your surgeon.  Keep your dressing clean and dry.  Follow the dressing change instructions as provided to you by your surgeon.  If you develop new or worsening symptoms including fever, chills, or if the surgical site becomes red, hot to the touch, or swollen, is return to the emergency department for re-evaluation.

## 2017-12-19 NOTE — ED Provider Notes (Signed)
Patient alert no distress..  Right upper extremity with sutured wound at the volar aspect of wrist.  No surrounding redness swelling or tenderness.  He has full range of motion.  Radial pulse 2+.   Doug Sou, MD 12/19/17 [phone]

## 2017-12-19 NOTE — ED Notes (Signed)
Pt states he had carpal tunnel surgery this past Friday. Presents today because he wants his dressing changed. Janese Banks is getting caught on everything. Wound edges are approximated. No erythema or edema. Dressing was removed and temporary dressing placed over wound. Moves fingers. Feels touch. Cap refill < 3 sec.

## 2020-03-03 ENCOUNTER — Encounter (HOSPITAL_BASED_OUTPATIENT_CLINIC_OR_DEPARTMENT_OTHER): Payer: Self-pay | Admitting: Emergency Medicine

## 2020-03-03 ENCOUNTER — Emergency Department (HOSPITAL_BASED_OUTPATIENT_CLINIC_OR_DEPARTMENT_OTHER)
Admission: EM | Admit: 2020-03-03 | Discharge: 2020-03-03 | Disposition: A | Discharge status: Home / Self Care | Payer: Medicare Other | Attending: Emergency Medicine | Admitting: Emergency Medicine

## 2020-03-03 ENCOUNTER — Emergency Department (HOSPITAL_BASED_OUTPATIENT_CLINIC_OR_DEPARTMENT_OTHER)
Admission: EM | Admit: 2020-03-03 | Discharge: 2020-03-03 | Disposition: A | Discharge status: Home / Self Care | Payer: Medicare Other | Source: Home / Self Care | Attending: Emergency Medicine | Admitting: Emergency Medicine

## 2020-03-03 ENCOUNTER — Other Ambulatory Visit: Payer: Self-pay

## 2020-03-03 DIAGNOSIS — Z87891 Personal history of nicotine dependence: Secondary | ICD-10-CM | POA: Insufficient documentation

## 2020-03-03 DIAGNOSIS — L509 Urticaria, unspecified: Secondary | ICD-10-CM | POA: Insufficient documentation

## 2020-03-03 DIAGNOSIS — T7840XA Allergy, unspecified, initial encounter: Secondary | ICD-10-CM

## 2020-03-03 MED ORDER — DEXAMETHASONE SODIUM PHOSPHATE 10 MG/ML IJ SOLN
10.0000 mg | Freq: Once | INTRAMUSCULAR | Status: AC
  Administered 2020-03-03: 10 mg via INTRAMUSCULAR
  Filled 2020-03-03: qty 1

## 2020-03-03 MED ORDER — DIPHENHYDRAMINE HCL 50 MG/ML IJ SOLN
50.0000 mg | Freq: Once | INTRAMUSCULAR | Status: AC
  Administered 2020-03-03: 50 mg via INTRAVENOUS
  Filled 2020-03-03: qty 1

## 2020-03-03 MED ORDER — FAMOTIDINE IN NACL 20-0.9 MG/50ML-% IV SOLN
20.0000 mg | Freq: Once | INTRAVENOUS | Status: AC
  Administered 2020-03-03: 20 mg via INTRAVENOUS
  Filled 2020-03-03: qty 50

## 2020-03-03 MED ORDER — DIPHENHYDRAMINE HCL 25 MG PO CAPS
50.0000 mg | ORAL_CAPSULE | Freq: Once | ORAL | Status: AC
  Administered 2020-03-03: 50 mg via ORAL
  Filled 2020-03-03: qty 2

## 2020-03-03 MED ORDER — SODIUM CHLORIDE 0.9 % IV SOLN
INTRAVENOUS | Status: DC | PRN
  Administered 2020-03-03: 500 mL via INTRAVENOUS

## 2020-03-03 MED ORDER — EPINEPHRINE 0.3 MG/0.3ML IJ SOAJ: 0.3000 mg | INTRAMUSCULAR | 1 refills | Status: AC | PRN

## 2020-03-03 MED ORDER — PREDNISONE 20 MG PO TABS: 40.0000 mg | ORAL_TABLET | Freq: Every day | ORAL | 0 refills | Status: DC

## 2020-03-03 MED ORDER — DEXAMETHASONE SODIUM PHOSPHATE 10 MG/ML IJ SOLN
10.0000 mg | Freq: Once | INTRAMUSCULAR | Status: AC
  Administered 2020-03-03: 10 mg via INTRAVENOUS
  Filled 2020-03-03: qty 1

## 2020-03-03 MED ORDER — SODIUM CHLORIDE 0.9 % IV BOLUS
1000.0000 mL | Freq: Once | INTRAVENOUS | Status: AC
  Administered 2020-03-03: 1000 mL via INTRAVENOUS

## 2020-03-03 NOTE — ED Notes (Signed)
Carrier fluid rate changed to 999 mL/h as ordered for IV bolus.

## 2020-03-03 NOTE — Discharge Instructions (Addendum)
You were evaluated in the Emergency Department and after careful evaluation, we did not find any emergent condition requiring admission or further testing in the hospital.  Your exam/testing today is overall reassuring.  Your symptoms are consistent with an allergic reaction.  We gave you an injection of steroids here in the emergency department.  Please take the steroid pills as prescribed starting tomorrow.  Use the EpiPen only for significant symptoms as we discussed.  We recommend follow-up with the allergy specialist.  Please return to the Emergency Department if you experience any worsening of your condition.   Thank you for allowing [place] to be a part of your care.

## 2020-03-03 NOTE — ED Triage Notes (Signed)
Generalized hives that started ~ 1 hr PTA.  Pt denies new soaps, lotions, medications or foods. Denies SHOB, diarrhea, vomiting or other sx. Denies trying any OTC meds PTA.

## 2020-03-03 NOTE — Discharge Instructions (Addendum)
Take 2 tablets of Benadryl every 6 hours for the next 2 days.  After that, see if you need the Benadryl, use it as needed for any itching or rash.  If you have any tongue swelling, throat swelling, difficulty breathing call 911.  Follow-up with your primary doctor to determine if you need to see an allergist or other specialist.

## 2020-03-03 NOTE — ED Notes (Signed)
Pt states he feels hot. VS rechecked with BP drop to 105/70. Pt c/o nausea and began heaving into emesis bag. EDP aware.

## 2020-03-03 NOTE — ED Notes (Signed)
Hives improved somewhat. Pts states overall he feels better.

## 2020-03-03 NOTE — ED Triage Notes (Signed)
Pt reports recurrent generalized hives, seen last night for same. Denies shortness of breath.  Benadryl/pepcid at home not effective.

## 2020-03-03 NOTE — ED Provider Notes (Signed)
[facility] EMERGENCY DEPARTMENT Provider Note   CSN: [id number] Arrival date & time: 03/03/20  0503     History Chief Complaint  Patient presents with  . Allergic Reaction    Joseph Humphrey is a 69 y.o. male.  Patient presents to the emergency department for evaluation of hives.  Patient reports that he woke up initially with hives on his abdomen and now they are spreading on his extremities diffusely.  He did not take any medications before coming to the ER.  Patient is not experiencing any tongue swelling, throat swelling, difficulty swallowing, shortness of breath, abdominal pain, nausea or vomiting.  He has not taken any new medications, used any new skin products or detergents, eaten any new foods.  He has never had a similar reaction.        Past Medical History:  Diagnosis Date  . Depression   . High cholesterol   . HIV (human immunodeficiency virus infection) (HCC)   . IBS (irritable bowel syndrome)   . Paresthesias 03/05/2015   Left hand  . Seizures [facility])     Patient Active Problem List   Diagnosis Date Noted  . Paresthesias 03/05/2015    Past Surgical History:  Procedure Laterality Date  . JOINT REPLACEMENT    . KNEE SURGERY     right  . ROTATOR CUFF REPAIR     right       No family history on file.  Social History   Tobacco Use  . Smoking status: Former Games developer  . Smokeless tobacco: Never Used  Substance Use Topics  . Alcohol use: Yes    Comment: occ  . Drug use: No    Home Medications Prior to Admission medications   Medication Sig Start Date End Date Taking? Authorizing Provider  dicyclomine (BENTYL) 10 MG capsule Take 10 mg by mouth 4 (four) times daily -  before meals and at bedtime.   Yes [provider]  atorvastatin (LIPITOR) 20 MG tablet Take 20 mg by mouth daily.    [provider]  azithromycin (ZITHROMAX Z-PAK) 250 MG tablet Take as directed 03/21/17   Cathren Laine, MD  benzonatate (TESSALON) 100  MG capsule Take 1 capsule (100 mg total) by mouth every 8 (eight) hours. 08/25/16   Fayrene Helper, PA-C  diphenoxylate-atropine (LOMOTIL) 2.5-0.025 MG per tablet Take 1 tablet by mouth 4 (four) times daily as needed.      [provider]  Dolutegravir-Rilpivirine (JULUCA) 50-25 MG TABS Take by mouth.    [provider]  EPINEPHrine (EPIPEN 2-PAK) 0.3 mg/0.3 mL IJ SOAJ injection Inject 0.3 mLs (0.3 mg total) into the muscle as needed for anaphylaxis. 03/03/20   Sabas Sous, MD  escitalopram (LEXAPRO) 10 MG tablet Take 10 mg by mouth daily.      [provider]  finasteride (PROSCAR) 5 MG tablet Take 5 mg by mouth daily.    [provider]  fluticasone (FLONASE) 50 MCG/ACT nasal spray Place 2 sprays into both nostrils daily. For three days 09/09/13 09/12/13  Gerhard Munch, MD  gabapentin (NEURONTIN) 300 MG capsule Take 300 mg by mouth 3 (three) times daily.    [provider]  lamivudine (EPIVIR) 300 MG tablet Take 300 mg by mouth daily.      [provider]  loperamide (IMODIUM A-D) 2 MG tablet Take 2 mg by mouth 4 (four) times daily as needed for diarrhea or loose stools.    [provider]  lopinavir-ritonavir (KALETRA) 200-50 MG  per tablet Take 2 tablets by mouth 2 (two) times daily.      [provider]  oseltamivir (TAMIFLU) 75 MG capsule Take 1 capsule (75 mg total) by mouth every 12 (twelve) hours. 08/25/16   Fayrene Helper, PA-C  oxybutynin (DITROPAN XL) 15 MG 24 hr tablet Take 15 mg by mouth daily after breakfast.    [provider]  phenytoin (DILANTIN) 100 MG ER capsule Take by mouth 3 (three) times daily.      [provider]  predniSONE (DELTASONE) 20 MG tablet Take 2 tablets (40 mg total) by mouth daily for 4 days. 03/03/20 03/07/20  Sabas Sous, MD  saquinavir (INVIRASE) 500 MG tablet Take 1,000 mg by mouth 2 (two) times daily.    [provider]    Allergies    Patient has no known  allergies.  Review of Systems   Review of Systems  HENT: Negative.   Skin: Positive for rash.  All other systems reviewed and are negative.   Physical Exam Updated Vital Signs BP (!) 149/87   Pulse 92   Temp 97.8 F (36.6 C) (Oral)   Resp 20   Ht 5\' 8"  (1.727 m)   Wt 74.8 kg   SpO2 95%   BMI 25.09 kg/m   Physical Exam Vitals and nursing note reviewed.  Constitutional:      General: He is not in acute distress.    Appearance: Normal appearance. He is well-developed.  HENT:     Head: Normocephalic and atraumatic.     Right Ear: Hearing normal.     Left Ear: Hearing normal.     Nose: Nose normal.  Eyes:     Conjunctiva/sclera: Conjunctivae normal.     Pupils: Pupils are equal, round, and reactive to light.  Cardiovascular:     Rate and Rhythm: Regular rhythm.     Heart sounds: S1 normal and S2 normal. No murmur heard.  No friction rub. No gallop.   Pulmonary:     Effort: Pulmonary effort is normal. No respiratory distress.     Breath sounds: Normal breath sounds.  Chest:     Chest wall: No tenderness.  Abdominal:     General: Bowel sounds are normal.     Palpations: Abdomen is soft.     Tenderness: There is no abdominal tenderness. There is no guarding or rebound. Negative signs include Murphy's sign and McBurney's sign.     Hernia: No hernia is present.  Musculoskeletal:        General: Normal range of motion.     Cervical back: Normal range of motion and neck supple.  Skin:    General: Skin is warm and dry.     Findings: Rash (diffuse urticaria) present.  Neurological:     Mental Status: He is alert and oriented to person, place, and time.     GCS: GCS eye subscore is 4. GCS verbal subscore is 5. GCS motor subscore is 6.     Cranial Nerves: No cranial nerve deficit.     Sensory: No sensory deficit.     Coordination: Coordination normal.  Psychiatric:        Speech: Speech normal.        Behavior: Behavior normal.        Thought Content: Thought content  normal.     ED Results / Procedures / Treatments   Labs (all labs ordered are listed, but only abnormal results are displayed) Labs Reviewed - No data to display  EKG None  Radiology No results found.  Procedures Procedures (including critical care time)  Medications Ordered in ED Medications  dexamethasone (DECADRON) injection 10 mg (10 mg Intravenous Given 03/03/20 0534)  diphenhydrAMINE (BENADRYL) injection 50 mg (50 mg Intravenous Given 03/03/20 0532)  famotidine (PEPCID) IVPB 20 mg premix (0 mg Intravenous Stopped 03/03/20 0606)  sodium chloride 0.9 % bolus 1,000 mL (0 mLs Intravenous Stopped 03/03/20 0720)    ED Course  I have reviewed the triage vital signs and the nursing notes.  Pertinent labs & imaging results that were available during my care of the patient were reviewed by me and considered in my medical decision making (see chart for details).    MDM Rules/Calculators/A&P                         Patient presents to the emergency department for evaluation of diffuse urticaria.  No signs of anaphylaxis.  Patient without any oropharyngeal involvement, breathing comfortably, no shortness of breath or wheezing.  Patient administered Benadryl, Pepcid, Decadron with improvement.  Etiology of the hives is unclear at this time.  Will discharge, continue Benadryl on scheduled doses for the next 2 days, then as needed.  Return for any recurrence.  Final Clinical Impression(s) / ED Diagnoses Final diagnoses:  Urticaria    Rx / DC Orders ED Discharge Orders    None       Alvey Brockel, Canary Brim, MD 03/04/20 [phone]

## 2020-03-04 ENCOUNTER — Emergency Department (HOSPITAL_BASED_OUTPATIENT_CLINIC_OR_DEPARTMENT_OTHER)
Admission: EM | Admit: 2020-03-04 | Discharge: 2020-03-04 | Discharge status: Absent without leave | Payer: Medicare Other

## 2020-03-04 ENCOUNTER — Other Ambulatory Visit: Payer: Self-pay

## 2020-03-06 ENCOUNTER — Other Ambulatory Visit: Payer: Self-pay

## 2020-03-06 ENCOUNTER — Emergency Department (HOSPITAL_BASED_OUTPATIENT_CLINIC_OR_DEPARTMENT_OTHER)
Admission: EM | Admit: 2020-03-06 | Discharge: 2020-03-06 | Disposition: A | Discharge status: Home / Self Care | Payer: Medicare Other | Attending: Emergency Medicine | Admitting: Emergency Medicine

## 2020-03-06 ENCOUNTER — Encounter (HOSPITAL_BASED_OUTPATIENT_CLINIC_OR_DEPARTMENT_OTHER): Payer: Self-pay

## 2020-03-06 DIAGNOSIS — L509 Urticaria, unspecified: Secondary | ICD-10-CM

## 2020-03-06 DIAGNOSIS — Z96651 Presence of right artificial knee joint: Secondary | ICD-10-CM | POA: Insufficient documentation

## 2020-03-06 DIAGNOSIS — R21 Rash and other nonspecific skin eruption: Secondary | ICD-10-CM | POA: Insufficient documentation

## 2020-03-06 DIAGNOSIS — Z87891 Personal history of nicotine dependence: Secondary | ICD-10-CM | POA: Insufficient documentation

## 2020-03-06 DIAGNOSIS — L299 Pruritus, unspecified: Secondary | ICD-10-CM | POA: Diagnosis present

## 2020-03-06 MED ORDER — DIPHENHYDRAMINE HCL 50 MG/ML IJ SOLN
25.0000 mg | Freq: Once | INTRAMUSCULAR | Status: AC
Start: 2020-03-06 — End: 2020-03-06
  Administered 2020-03-06: 25 mg via INTRAVENOUS
  Filled 2020-03-06: qty 1

## 2020-03-06 MED ORDER — EPINEPHRINE 0.3 MG/0.3ML IJ SOAJ
0.3000 mg | Freq: Once | INTRAMUSCULAR | Status: AC
  Administered 2020-03-06: 0.3 mg via INTRAMUSCULAR
  Filled 2020-03-06: qty 0.3

## 2020-03-06 MED ORDER — METHYLPREDNISOLONE SODIUM SUCC 125 MG IJ SOLR
125.0000 mg | Freq: Once | INTRAMUSCULAR | Status: AC
  Administered 2020-03-06: 125 mg via INTRAVENOUS
  Filled 2020-03-06: qty 2

## 2020-03-06 MED ORDER — PREDNISONE 10 MG PO TABS
20.0000 mg | ORAL_TABLET | Freq: Two times a day (BID) | ORAL | 0 refills | Status: DC
Start: 2020-03-06 — End: 2021-02-05

## 2020-03-06 MED ORDER — HYDROXYZINE HCL 25 MG PO TABS
25.0000 mg | ORAL_TABLET | Freq: Four times a day (QID) | ORAL | 0 refills | Status: AC
Start: 2020-03-06 — End: ?

## 2020-03-06 NOTE — Discharge Instructions (Signed)
Continue prednisone as previously prescribed.  Begin taking hydroxyzine as prescribed this evening.  Follow-up with your allergist as scheduled, and return to the ER if symptoms significantly worsen or change.

## 2020-03-06 NOTE — ED Triage Notes (Signed)
Here 7/11 x 2 for generalized rash all over body, No SOB, Benadryl/steriods with little relief. Denies Pepcid use. NAD. States he cannot stand itching, has allergist appt on 7/28

## 2020-03-06 NOTE — ED Notes (Signed)
Patient states itching is now well controlled. Denies itching at this time. Provider aware.

## 2020-03-06 NOTE — ED Provider Notes (Signed)
[facility] EMERGENCY DEPARTMENT Provider Note   CSN: [id number] Arrival date & time: 03/06/20  1845     History No chief complaint on file.   Joseph Humphrey is a 69 y.o. male.  Patient is a 69 year old male with history of HIV disease, irritable bowel, depression, hyperlipidemia.  He presents today for evaluation of itching.  Patient started several days ago with generalized rash thought to be allergic in nature.  He was given IV Benadryl and steroids with some improvement initially, however the rash has returned.  He has been taking prednisone and Benadryl along with Pepcid with little relief.  He denies any new contacts or exposures.  He denies any difficulty breathing or swallowing.  He denies any throat swelling.  The history is provided by the patient.       Past Medical History:  Diagnosis Date  . Depression   . High cholesterol   . HIV (human immunodeficiency virus infection) (HCC)   . IBS (irritable bowel syndrome)   . Paresthesias 03/05/2015   Left hand  . Seizures [facility])     Patient Active Problem List   Diagnosis Date Noted  . Paresthesias 03/05/2015    Past Surgical History:  Procedure Laterality Date  . JOINT REPLACEMENT    . KNEE SURGERY     right  . ROTATOR CUFF REPAIR     right       History reviewed. No pertinent family history.  Social History   Tobacco Use  . Smoking status: Former Games developer  . Smokeless tobacco: Never Used  Substance Use Topics  . Alcohol use: Yes    Comment: occ  . Drug use: No    Home Medications Prior to Admission medications   Medication Sig Start Date End Date Taking? Authorizing Provider  atorvastatin (LIPITOR) 20 MG tablet Take 20 mg by mouth daily.    [provider]  azithromycin (ZITHROMAX Z-PAK) 250 MG tablet Take as directed 03/21/17   Cathren Laine, MD  benzonatate (TESSALON) 100 MG capsule Take 1 capsule (100 mg total) by mouth every 8 (eight) hours. 08/25/16   Fayrene Helper, PA-C    dicyclomine (BENTYL) 10 MG capsule Take 10 mg by mouth 4 (four) times daily -  before meals and at bedtime.    [provider]  diphenoxylate-atropine (LOMOTIL) 2.5-0.025 MG per tablet Take 1 tablet by mouth 4 (four) times daily as needed.      [provider]  Dolutegravir-Rilpivirine (JULUCA) 50-25 MG TABS Take by mouth.    [provider]  EPINEPHrine (EPIPEN 2-PAK) 0.3 mg/0.3 mL IJ SOAJ injection Inject 0.3 mLs (0.3 mg total) into the muscle as needed for anaphylaxis. 03/03/20   Sabas Sous, MD  escitalopram (LEXAPRO) 10 MG tablet Take 10 mg by mouth daily.      [provider]  finasteride (PROSCAR) 5 MG tablet Take 5 mg by mouth daily.    [provider]  fluticasone (FLONASE) 50 MCG/ACT nasal spray Place 2 sprays into both nostrils daily. For three days 09/09/13 09/12/13  Gerhard Munch, MD  gabapentin (NEURONTIN) 300 MG capsule Take 300 mg by mouth 3 (three) times daily.    [provider]  lamivudine (EPIVIR) 300 MG tablet Take 300 mg by mouth daily.      [provider]  loperamide (IMODIUM A-D) 2 MG tablet Take 2 mg by mouth 4 (four) times daily as needed for diarrhea or loose stools.    [provider]  lopinavir-ritonavir [facility])  200-50 MG per tablet Take 2 tablets by mouth 2 (two) times daily.      [provider]  oseltamivir (TAMIFLU) 75 MG capsule Take 1 capsule (75 mg total) by mouth every 12 (twelve) hours. 08/25/16   Fayrene Helper, PA-C  oxybutynin (DITROPAN XL) 15 MG 24 hr tablet Take 15 mg by mouth daily after breakfast.    [provider]  phenytoin (DILANTIN) 100 MG ER capsule Take by mouth 3 (three) times daily.      [provider]  predniSONE (DELTASONE) 20 MG tablet Take 2 tablets (40 mg total) by mouth daily for 4 days. 03/03/20 03/07/20  Sabas Sous, MD  saquinavir (INVIRASE) 500 MG tablet Take 1,000 mg by mouth 2 (two) times daily.    [provider]     Allergies    Patient has no known allergies.  Review of Systems   Review of Systems  All other systems reviewed and are negative.   Physical Exam Updated Vital Signs BP (!) 143/76 (BP Location: Right Arm)   Pulse (!) 103   Temp 98.3 F (36.8 C) (Oral)   Resp 19   Ht 5\' 8"  (1.727 m)   Wt 74.8 kg   SpO2 94%   BMI 25.09 kg/m   Physical Exam Vitals and nursing note reviewed.  Constitutional:      General: He is not in acute distress.    Appearance: He is well-developed. He is not diaphoretic.  HENT:     Head: Normocephalic and atraumatic.  Cardiovascular:     Rate and Rhythm: Normal rate and regular rhythm.     Heart sounds: No murmur heard.  No friction rub.  Pulmonary:     Effort: Pulmonary effort is normal. No respiratory distress.     Breath sounds: Normal breath sounds. No wheezing or rales.  Abdominal:     General: Bowel sounds are normal. There is no distension.     Palpations: Abdomen is soft.     Tenderness: There is no abdominal tenderness.  Musculoskeletal:        General: Normal range of motion.     Cervical back: Normal range of motion and neck supple.  Skin:    General: Skin is warm and dry.     Findings: Rash present.     Comments: Patient has a diffuse, urticarial rash to the arms, torso, and head.  Neurological:     Mental Status: He is alert and oriented to person, place, and time.     Coordination: Coordination normal.     ED Results / Procedures / Treatments   Labs (all labs ordered are listed, but only abnormal results are displayed) Labs Reviewed - No data to display  EKG None  Radiology No results found.  Procedures Procedures (including critical care time)  Medications Ordered in ED Medications  methylPREDNISolone sodium succinate (SOLU-MEDROL) 125 mg/2 mL injection 125 mg (has no administration in time range)  EPINEPHrine (EPI-PEN) injection 0.3 mg (has no administration in time range)  diphenhydrAMINE (BENADRYL)  injection 25 mg (has no administration in time range)    ED Course  I have reviewed the triage vital signs and the nursing notes.  Pertinent labs & imaging results that were available during my care of the patient were reviewed by me and considered in my medical decision making (see chart for details).    MDM Rules/Calculators/A&P  Patient is a 69 year old male presenting with complaints of itching and rash.  This is his third  visit to the ER in the past several days for this.  He denies any new contacts or exposures.  He does have an urticarial rash, the etiology of which I am uncertain.  Patient given Solu-Medrol, Benadryl, and an EpiPen here in the ER and his itching has resolved.  I will extend his course of prednisone by 3 days and prescribed hydroxyzine which he can take for itching.  He is to follow-up with his allergist as scheduled in the near future.  Final Clinical Impression(s) / ED Diagnoses Final diagnoses:  None    Rx / DC Orders ED Discharge Orders    None       Geoffery Lyons, MD 03/06/20 2205

## 2020-03-07 NOTE — ED Provider Notes (Signed)
[facility]EMERGENCY DEPT [facility] Emergency Department Provider Note MRN:  [id number]  Arrival date & time: 03/07/20     Chief Complaint   Allergic Reaction   History of Present Illness   Joseph Humphrey is a 69 y.o. year-old male with a history of HIV presenting to the ED with chief complaint of large rash.  Patient recently diagnosed with allergic reaction, unclear trigger.  Sent home and advised to take Benadryl.  Symptoms returning this evening, rash, itching.  Denies nausea or vomiting, no diarrhea, no shortness of breath, no facial swelling.  Symptoms moderate, constant, no exacerbating relieving factors.  Review of Systems  A complete 10 system review of systems was obtained and all systems are negative except as noted in the HPI and PMH.   Patient's Health History    Past Medical History:  Diagnosis Date  . Depression   . High cholesterol   . HIV (human immunodeficiency virus infection) (HCC)   . IBS (irritable bowel syndrome)   . Paresthesias 03/05/2015   Left hand  . Seizures (HCC)     Past Surgical History:  Procedure Laterality Date  . JOINT REPLACEMENT    . KNEE SURGERY     right  . ROTATOR CUFF REPAIR     right    No family history on file.  Social History   Socioeconomic History  . Marital status: Single    Spouse name: Not on file  . Number of children: Not on file  . Years of education: Not on file  . Highest education level: Not on file  Occupational History  . Not on file  Tobacco Use  . Smoking status: Former Games developer  . Smokeless tobacco: Never Used  Substance and Sexual Activity  . Alcohol use: Yes    Comment: occ  . Drug use: No  . Sexual activity: Not on file  Other Topics Concern  . Not on file  Social History Narrative  . Not on file   Social Determinants of Health   Financial Resource Strain:   . Difficulty of Paying Living Expenses:   Food Insecurity:   . Worried About Programme researcher, broadcasting/film/video in the Last Year:   . Garment/textile technologist in the Last Year:   Transportation Needs:   . Freight forwarder (Medical):   Marland Kitchen Lack of Transportation (Non-Medical):   Physical Activity:   . Days of Exercise per Week:   . Minutes of Exercise per Session:   Stress:   . Feeling of Stress :   Social Connections:   . Frequency of Communication with Friends and Family:   . Frequency of Social Gatherings with Friends and Family:   . Attends Religious Services:   . Active Member of Clubs or Organizations:   . Attends Banker Meetings:   Marland Kitchen Marital Status:   Intimate Partner Violence:   . Fear of Current or Ex-Partner:   . Emotionally Abused:   Marland Kitchen Physically Abused:   . Sexually Abused:      Physical Exam   Vitals:   03/03/20 1908 03/03/20 2227  BP: 122/87 129/82  Pulse: 95 81  Resp: 18 20  Temp: 99 F (37.2 C)   SpO2: 100% 96%    CONSTITUTIONAL: Well-appearing, NAD NEURO:  Alert and oriented x 3, no focal deficits EYES:  eyes equal and reactive ENT/NECK:  no LAD, no JVD CARDIO: Regular rate, well-perfused, normal S1 and S2 PULM:  CTAB no wheezing or rhonchi GI/GU:  normal  bowel sounds, non-distended, non-tender MSK/SPINE:  No gross deformities, no edema SKIN:  no rash, atraumatic PSYCH:  Appropriate speech and behavior  *Additional and/or pertinent findings included in MDM below  Diagnostic and Interventional Summary    EKG Interpretation  Date/Time:    Ventricular Rate:    PR Interval:    QRS Duration:   QT Interval:    QTC Calculation:   R Axis:     Text Interpretation:        Labs Reviewed - No data to display  No orders to display    Medications  diphenhydrAMINE (BENADRYL) capsule 50 mg (50 mg Oral Given 03/03/20 1916)  dexamethasone (DECADRON) injection 10 mg (10 mg Intramuscular Given 03/03/20 2245)     Procedures  /  Critical Care Procedures  ED Course and Medical Decision Making  I have reviewed the triage vital signs, the nursing notes, and pertinent available records  from the EMR.  Listed above are laboratory and imaging tests that I personally ordered, reviewed, and interpreted and then considered in my medical decision making (see below for details).      Repeat visit for allergic reaction, no obvious rash in my exam, no acute distress, seems to be improved with Benadryl provided in triage.  Nothing to suggest anaphylaxis or emergent condition at this time, will provide steroid burst.    Elmer Sow. Pilar Plate, MD [facility] Emergency Medicine [facility] [email or web address]  Final Clinical Impressions(s) / ED Diagnoses     ICD-10-CM   1. Allergic reaction, initial encounter  T78.40XA     ED Discharge Orders         Ordered    predniSONE (DELTASONE) 20 MG tablet  Daily,   Status:  Discontinued     Reprint     03/03/20 2259    EPINEPHrine (EPIPEN 2-PAK) 0.3 mg/0.3 mL IJ SOAJ injection  As needed     Discontinue  Reprint     03/03/20 2300           Discharge Instructions Discussed with and Provided to Patient:     Discharge Instructions     You were evaluated in the Emergency Department and after careful evaluation, we did not find any emergent condition requiring admission or further testing in the hospital.  Your exam/testing today is overall reassuring.  Your symptoms are consistent with an allergic reaction.  We gave you an injection of steroids here in the emergency department.  Please take the steroid pills as prescribed starting tomorrow.  Use the EpiPen only for significant symptoms as we discussed.  We recommend follow-up with the allergy specialist.  Please return to the Emergency Department if you experience any worsening of your condition.   Thank you for allowing [place] to be a part of your care.      Sabas Sous, MD 03/07/20 [phone]

## 2020-03-18 ENCOUNTER — Ambulatory Visit: Payer: Medicare Other | Admitting: Allergy

## 2020-04-04 ENCOUNTER — Ambulatory Visit: Payer: Medicare Other | Admitting: Allergy and Immunology

## 2021-02-05 ENCOUNTER — Emergency Department (HOSPITAL_BASED_OUTPATIENT_CLINIC_OR_DEPARTMENT_OTHER)
Admission: EM | Admit: 2021-02-05 | Discharge: 2021-02-05 | Disposition: A | Discharge status: Home / Self Care | Payer: Medicare Other | Attending: Emergency Medicine | Admitting: Emergency Medicine

## 2021-02-05 ENCOUNTER — Encounter (HOSPITAL_BASED_OUTPATIENT_CLINIC_OR_DEPARTMENT_OTHER): Payer: Self-pay | Admitting: *Deleted

## 2021-02-05 ENCOUNTER — Other Ambulatory Visit: Payer: Self-pay

## 2021-02-05 ENCOUNTER — Emergency Department (HOSPITAL_BASED_OUTPATIENT_CLINIC_OR_DEPARTMENT_OTHER): Payer: Medicare Other

## 2021-02-05 DIAGNOSIS — Z21 Asymptomatic human immunodeficiency virus [HIV] infection status: Secondary | ICD-10-CM | POA: Diagnosis not present

## 2021-02-05 DIAGNOSIS — S62634A Displaced fracture of distal phalanx of right ring finger, initial encounter for closed fracture: Secondary | ICD-10-CM | POA: Diagnosis not present

## 2021-02-05 DIAGNOSIS — W1839XA Other fall on same level, initial encounter: Secondary | ICD-10-CM | POA: Insufficient documentation

## 2021-02-05 DIAGNOSIS — Z9660 Presence of unspecified orthopedic joint implant: Secondary | ICD-10-CM | POA: Insufficient documentation

## 2021-02-05 DIAGNOSIS — Z87891 Personal history of nicotine dependence: Secondary | ICD-10-CM | POA: Insufficient documentation

## 2021-02-05 DIAGNOSIS — S6991XA Unspecified injury of right wrist, hand and finger(s), initial encounter: Secondary | ICD-10-CM | POA: Diagnosis present

## 2021-02-05 DIAGNOSIS — S6990XA Unspecified injury of unspecified wrist, hand and finger(s), initial encounter: Secondary | ICD-10-CM

## 2021-02-05 MED ORDER — KETOROLAC TROMETHAMINE 60 MG/2ML IM SOLN
60.0000 mg | Freq: Once | INTRAMUSCULAR | Status: AC
  Administered 2021-02-05: 60 mg via INTRAMUSCULAR
  Filled 2021-02-05: qty 2

## 2021-02-05 NOTE — Discharge Instructions (Addendum)
You have a small, nondisplaced fracture towards the end of your ring finger.  Please wear the splint for protection.  You may remove for showers.  I recommend ibuprofen 600 mg every 6 hours as needed for pain control.  I would like for you to follow-up with your orthopedist at [facility].  However, I have also provided you with referral to Dr. Melvyn Novas who is a hand specialist with [facility].  Please keep the hand elevated and ice for the next 24 hours.  Return to the ER seek immediate medical attention should you experience any new or worsening symptoms.

## 2021-02-05 NOTE — ED Provider Notes (Signed)
[facility] EMERGENCY DEPARTMENT Provider Note   CSN: [id number] Arrival date & time: 02/05/21  1824     History Chief Complaint  Patient presents with   Finger Injury         Joseph Humphrey is a 70 y.o. male with no significant past medical history presents the ED with complaints of injury to right ring finger.  I obtained history from his husband was at bedside.  He reports that patient stumbled forward while  bent low to the ground and fell onto an outstretched hand.  He reports that he jammed his ring finger. There was no laceration or wound.  However, it is notably swollen and there is concern for bony abnormality.  There was no head injury or loss of consciousness.  Patient denies any other injuries.  He is not on any blood thinners.  He is right-hand dominant.  HPI     Past Medical History:  Diagnosis Date   Depression    High cholesterol    HIV (human immunodeficiency virus infection) (HCC)    IBS (irritable bowel syndrome)    Paresthesias 03/05/2015   Left hand   Seizures (HCC)     Patient Active Problem List   Diagnosis Date Noted   Paresthesias 03/05/2015    Past Surgical History:  Procedure Laterality Date   JOINT REPLACEMENT     KNEE SURGERY     right   ROTATOR CUFF REPAIR     right       No family history on file.  Social History   Tobacco Use   Smoking status: Former    Pack years: 0.00   Smokeless tobacco: Never  Substance Use Topics   Alcohol use: Yes    Comment: occ   Drug use: No    Home Medications Prior to Admission medications   Medication Sig Start Date End Date Taking? Authorizing Provider  dicyclomine (BENTYL) 20 MG tablet Take by mouth. 08/08/20  Yes [provider]  atorvastatin (LIPITOR) 20 MG tablet Take 20 mg by mouth daily.    [provider]  EPINEPHrine (EPIPEN 2-PAK) 0.3 mg/0.3 mL IJ SOAJ injection Inject 0.3 mLs (0.3 mg total) into the muscle as needed for anaphylaxis. 03/03/20    Sabas Sous, MD  escitalopram (LEXAPRO) 10 MG tablet Take 10 mg by mouth daily.      [provider]  finasteride (PROSCAR) 5 MG tablet Take 5 mg by mouth daily.    [provider]  fluticasone (FLONASE) 50 MCG/ACT nasal spray Place 2 sprays into both nostrils daily. For three days 09/09/13 09/12/13  Gerhard Munch, MD  gabapentin (NEURONTIN) 300 MG capsule Take 300 mg by mouth 3 (three) times daily.    [provider]  hydrOXYzine (ATARAX/VISTARIL) 25 MG tablet Take 1 tablet (25 mg total) by mouth every 6 (six) hours. 03/06/20   Geoffery Lyons, MD  loperamide (IMODIUM A-D) 2 MG tablet Take 2 mg by mouth 4 (four) times daily as needed for diarrhea or loose stools.    [provider]  lopinavir-ritonavir (KALETRA) 200-50 MG per tablet Take 2 tablets by mouth 2 (two) times daily.      [provider]  saquinavir (INVIRASE) 500 MG tablet Take 1,000 mg by mouth 2 (two) times daily.    [provider]  SYMTUZA 800-150-200-10 MG TABS Take 1 tablet by mouth daily. 01/30/21   [provider]  testosterone cypionate (DEPOTESTOSTERONE CYPIONATE) 200 MG/ML injection Inject 200 mg into the  muscle once a week. 01/30/21   [provider]    Allergies    Patient has no known allergies.  Review of Systems   Review of Systems  All other systems reviewed and are negative.  Physical Exam Updated Vital Signs BP (!) 164/102 (BP Location: Left Arm)   Pulse 85   Temp 98 F (36.7 C) (Oral)   Resp 20   Ht 5\' 8"  (1.727 m)   Wt 72.6 kg   SpO2 94%   BMI 24.33 kg/m   Physical Exam Vitals and nursing note reviewed. Exam conducted with a chaperone present.  Constitutional:      Appearance: Normal appearance.  HENT:     Head: Normocephalic and atraumatic.  Eyes:     General: No scleral icterus.    Conjunctiva/sclera: Conjunctivae normal.  Cardiovascular:     Rate and Rhythm: Normal rate.     Pulses: Normal pulses.  Pulmonary:      Effort: Pulmonary effort is normal.  Musculoskeletal:     Comments: Right hand, fourth digit: Tenderness over DIP joint.  Mild associated swelling and ecchymoses.  Brisk capillary refill.  Can flex and extend against resistance.  Radial pulse intact.  No wounds.  No subungual hematoma.  No other injuries.  Skin:    General: Skin is dry.  Neurological:     General: No focal deficit present.     Mental Status: He is alert and oriented to person, place, and time.     GCS: GCS eye subscore is 4. GCS verbal subscore is 5. GCS motor subscore is 6.  Psychiatric:        Mood and Affect: Mood normal.        Behavior: Behavior normal.        Thought Content: Thought content normal.    ED Results / Procedures / Treatments   Labs (all labs ordered are listed, but only abnormal results are displayed) Labs Reviewed - No data to display  EKG None  Radiology DG Finger Ring Right  Result Date: 02/05/2021 CLINICAL DATA:  Fall, right ring finger injury EXAM: RIGHT RING FINGER 2+V COMPARISON:  None. FINDINGS: Fracture at the posterior base of the right ring finger distal phalanx, presumably acute although this could be chronic. Recommend clinical correlation for pain in this area. No subluxation or dislocation. Soft tissues are intact. IMPRESSION: Fracture of the posterior base of the right ring finger distal phalanx, acute versus chronic. Recommend correlation for pain in this area. Electronically Signed   By: 02/07/2021 M.D.   On: 02/05/2021 19:07    Procedures Procedures   Medications Ordered in ED Medications  ketorolac (TORADOL) injection 60 mg (has no administration in time range)    ED Course  I have reviewed the triage vital signs and the nursing notes.  Pertinent labs & imaging results that were available during my care of the patient were reviewed by me and considered in my medical decision making (see chart for details).    MDM Rules/Calculators/A&P                           Joseph Humphrey was evaluated in Emergency Department on 02/05/2021 for the symptoms described in the history of present illness. He was evaluated in the context of the global COVID-19 pandemic, which necessitated consideration that the patient might be at risk for infection with the SARS-CoV-2 virus that causes COVID-19. Institutional protocols and algorithms that pertain to the  evaluation of patients at risk for COVID-19 are in a state of rapid change based on information released by regulatory bodies including the CDC and federal and state organizations. These policies and algorithms were followed during the patient's care in the ED.  I personally reviewed patient's medical chart and all notes from triage and staff during today's encounter. I have also ordered and reviewed all labs and imaging that I felt to be medically necessary in the evaluation of this patient's complaints and with consideration of their physical exam. If needed, translation services were available and utilized.   Plain films of hand are personally reviewed which demonstrate nondisplaced fracture at the posterior base of right ring finger, distal phalanx.  Will refer patient to Dr. Damaris Hippo, for ongoing evaluation and management of his hand injury.  However, patient states that he would rather follow-up with Delbert Harness who he sees for orthopedics.  Recommended ibuprofen 600 mg every 6 hours as needed for pain control.  Encouraged him to keep his hand elevated.  Ice x24 hours.  Offered short course of narcotics, but he declined.  Will apply finger splint and buddy tape to middle finger.  ER return precautions discussed.  Patient voices understanding and is agreeable to the plan.  Final Clinical Impression(s) / ED Diagnoses Final diagnoses:  Jammed finger (interphalangeal joint), initial encounter  Closed displaced fracture of distal phalanx of right ring finger, initial encounter    Rx / DC Orders ED  Discharge Orders     None        Lorelee New, PA-C 02/05/21 1953    Vanetta Mulders, MD 02/06/21 1336

## 2021-02-05 NOTE — ED Triage Notes (Signed)
C/o right ring finger injury x 1 hr ago

## 2022-02-21 IMAGING — CR DG FINGER RING 2+V*R*
3 series · 3 of 3 positions shown · non-contrast
Comparison: None.

CLINICAL DATA: Fall, right ring finger injury

EXAM:
RIGHT RING FINGER 2+V

[x finger pa right]
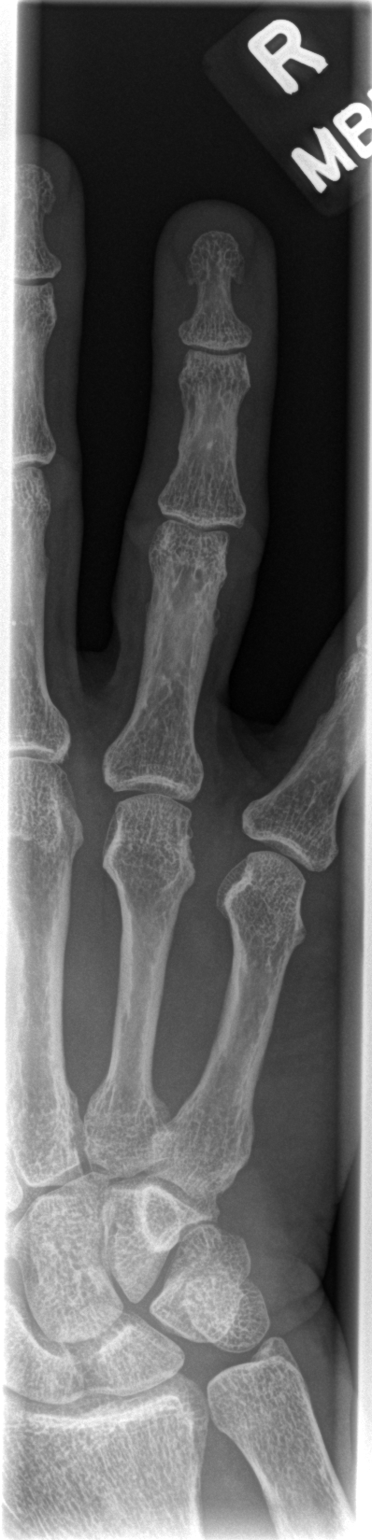

[x finger obl. right]
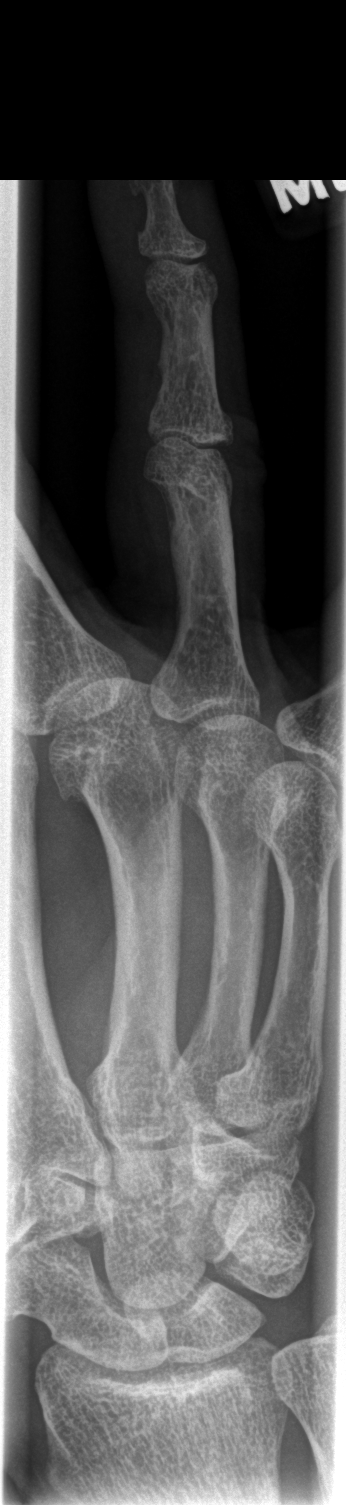

[x finger lateral right]
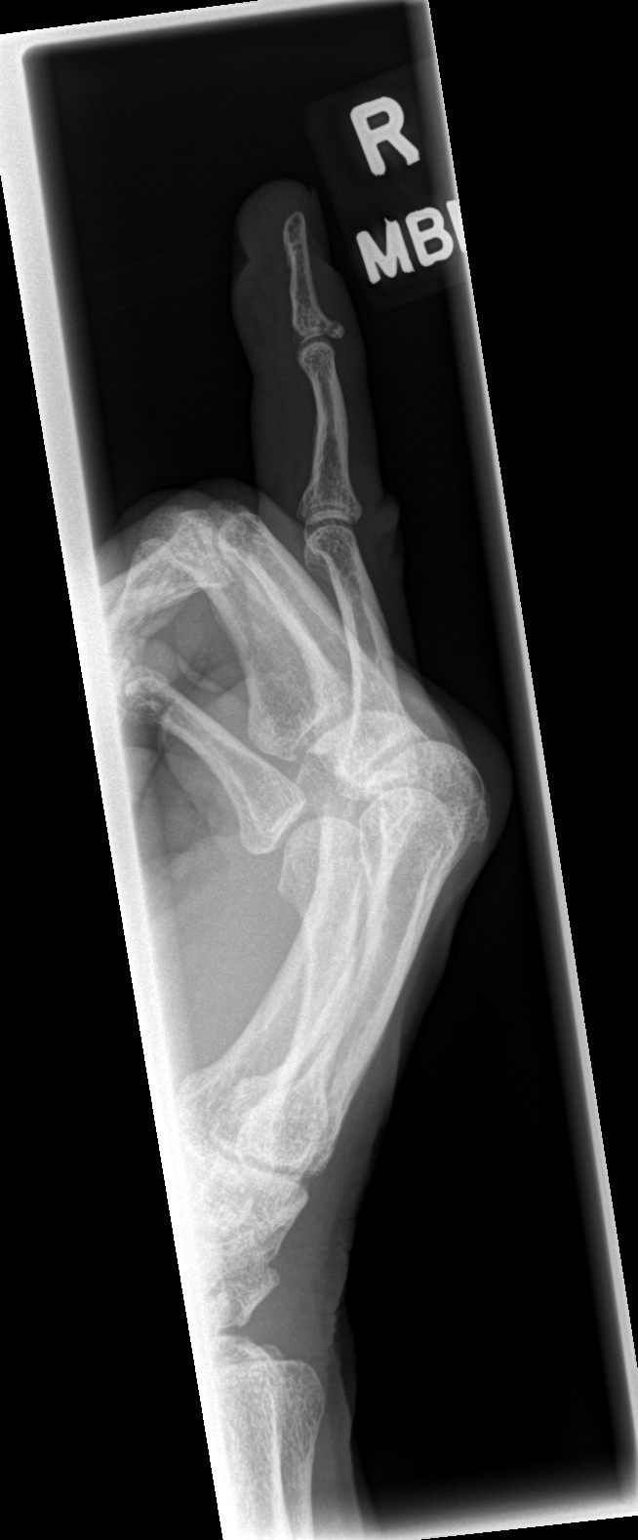

[3 of 3 positions shown; findings below may reference images not displayed]

FINDINGS: Fracture at the posterior base of the right ring finger distal
phalanx, presumably acute although this could be chronic. Recommend
clinical correlation for pain in this area. No subluxation or
dislocation. Soft tissues are intact.
IMPRESSION: Fracture of the posterior base of the right ring finger distal
phalanx, acute versus chronic. Recommend correlation for pain in
this area.
